# Patient Record
Sex: Female | Born: 1965 | Race: Black or African American | Hispanic: No | Smoking: Never smoker
Health system: Southern US, Community
[De-identification: ages and names within clinical notes are randomized; demographics above are authoritative.]

## PROBLEM LIST (undated history)

## (undated) DIAGNOSIS — K5790 Diverticulosis of intestine, part unspecified, without perforation or abscess without bleeding: Secondary | ICD-10-CM

## (undated) DIAGNOSIS — M1990 Unspecified osteoarthritis, unspecified site: Secondary | ICD-10-CM

## (undated) DIAGNOSIS — Z860100 Personal history of colon polyps, unspecified: Secondary | ICD-10-CM

## (undated) DIAGNOSIS — E669 Obesity, unspecified: Secondary | ICD-10-CM

## (undated) DIAGNOSIS — E7800 Pure hypercholesterolemia, unspecified: Secondary | ICD-10-CM

## (undated) DIAGNOSIS — M329 Systemic lupus erythematosus, unspecified: Secondary | ICD-10-CM

## (undated) DIAGNOSIS — H8109 Meniere's disease, unspecified ear: Secondary | ICD-10-CM

## (undated) DIAGNOSIS — I10 Essential (primary) hypertension: Secondary | ICD-10-CM

## (undated) HISTORY — DX: Diverticulosis of intestine, part unspecified, without perforation or abscess without bleeding: K57.90

## (undated) HISTORY — DX: Meniere's disease, unspecified ear: H81.09

## (undated) HISTORY — DX: Systemic lupus erythematosus, unspecified: M32.9

## (undated) HISTORY — PX: OTHER SURGICAL HISTORY: SHX169

## (undated) HISTORY — PX: COLONOSCOPY: SHX174

## (undated) HISTORY — DX: Obesity, unspecified: E66.9

## (undated) HISTORY — DX: Pure hypercholesterolemia, unspecified: E78.00

## (undated) HISTORY — DX: Unspecified osteoarthritis, unspecified site: M19.90

## (undated) HISTORY — DX: Personal history of colon polyps, unspecified: Z86.0100

---

## 1984-08-19 DIAGNOSIS — M329 Systemic lupus erythematosus, unspecified: Secondary | ICD-10-CM

## 1984-08-19 HISTORY — DX: Systemic lupus erythematosus, unspecified: M32.9

## 1998-08-19 DIAGNOSIS — I10 Essential (primary) hypertension: Secondary | ICD-10-CM

## 1998-08-19 HISTORY — DX: Essential (primary) hypertension: I10

## 2003-08-20 DIAGNOSIS — H8109 Meniere's disease, unspecified ear: Secondary | ICD-10-CM

## 2003-08-20 HISTORY — DX: Meniere's disease, unspecified ear: H81.09

## 2003-11-25 ENCOUNTER — Ambulatory Visit (HOSPITAL_COMMUNITY): Admission: RE | Admit: 2003-11-25 | Discharge: 2003-11-25 | Payer: Self-pay | Admitting: Obstetrics & Gynecology

## 2005-03-19 IMAGING — CT CT ABDOMEN W/O CM
1 series · 15 of 32 positions shown, 19 images · IV contrast (agent unspecified)
Comparison: none

CLINICAL DATA: Right renal colic.  Right lower abdominal and pelvic pain.  Question ureteral stone or renal obstruction.
TECHNIQUE: Spiral CT of the abdomen and pelvis was performed following the renal stone protocol.  No oral or intravenous contrast was administered.
 ABDOMEN CT WITHOUT CONTRAST:
 No intrarenal calculi are seen.  Both kidneys are normal in contour and there is no evidence of perinephric fluid or inflammatory change.  There is no evidence of hydronephrosis involving either kidney.  
 The visualized portions of the other abdominal parenchymal organs are unremarkable on this noncontrast study.  The unopacified bowel loops are unremarkable.  No mass or inflammatory process is identified.  
 IMPRESSION
 Unremarkable noncontrast abdominal CT.  No evidence of renal calculi or hydronephrosis.
 PELVIS CT WITHOUT CONTRAST:
 There is no evidence of ureteral calculi or dilatation.  No calculi are seen within the urinary bladder.  
 There is no evidence of pelvic masses. No inflammatory process or abnormal fluid collections are seen.  The unopacified bowel loops are unremarkable.
 Negative noncontrast pelvis CT.  No evidence of ureteral calculi or other acute process.

[Series 2: — · axial · 0.62mm/px · z∈[-317,+13]mm · 15 of 74 slices shown, 19 images]
[im 5/74  soft-tissue]
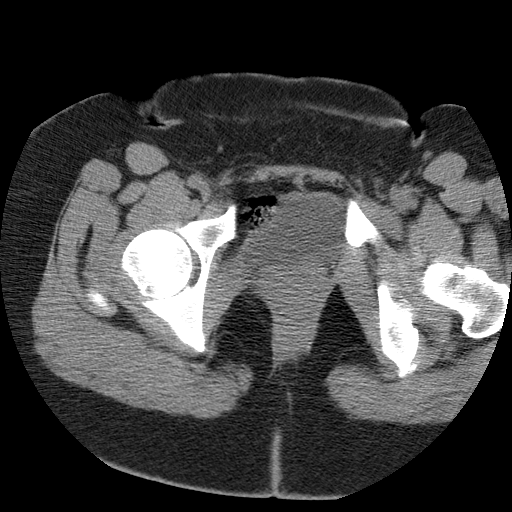
[im 5/74  bone]
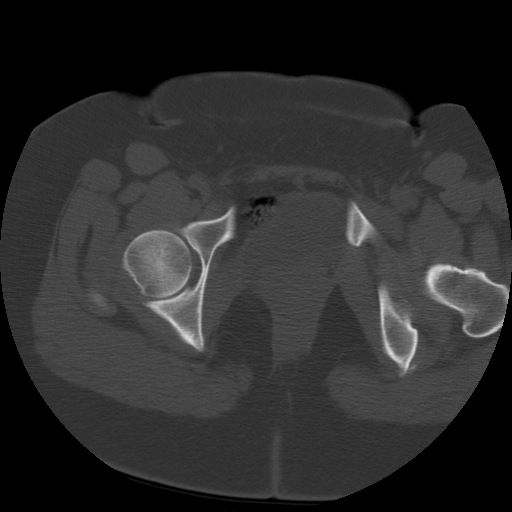
[im 10/74  soft-tissue]
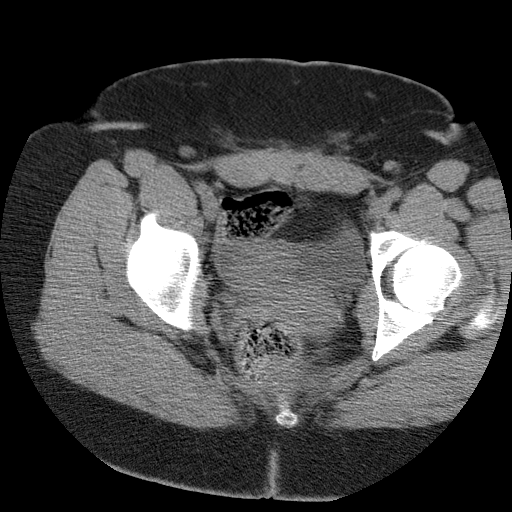
[im 15/74  soft-tissue]
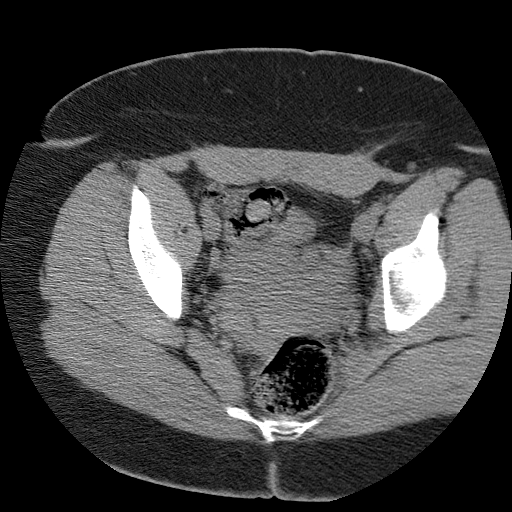
[im 22/74  soft-tissue]
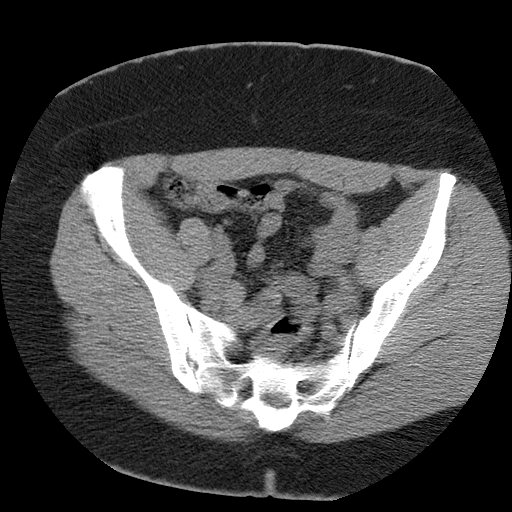
[im 26/74  soft-tissue]
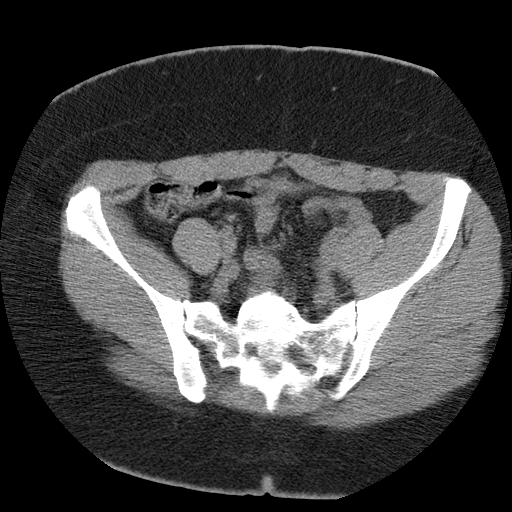
[im 31/74  soft-tissue]
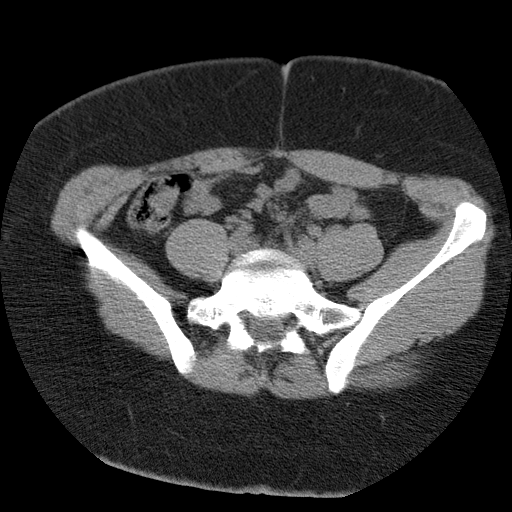
[im 38/74  soft-tissue]
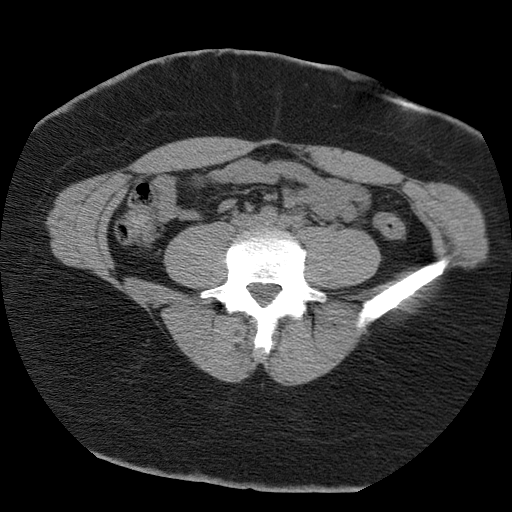
[im 43/74  soft-tissue]
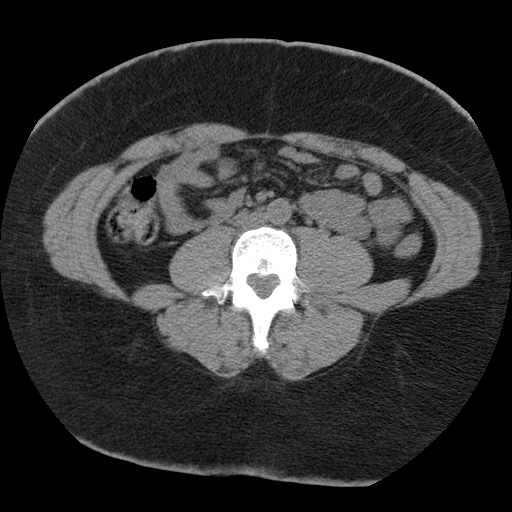
[im 48/74  soft-tissue]
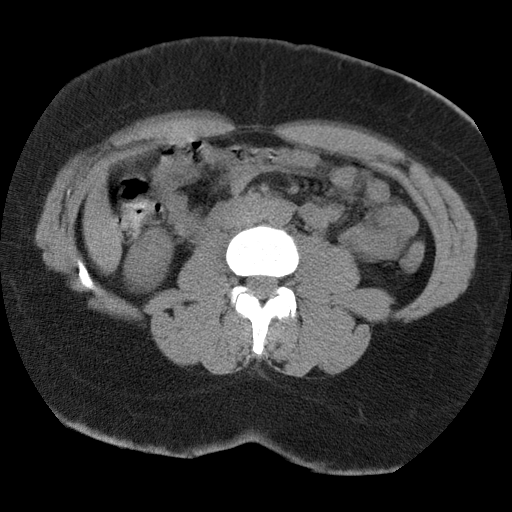
[im 48/74  bone]
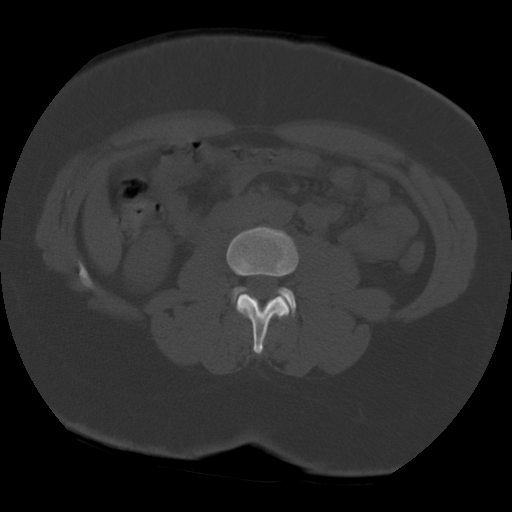
[im 52/74  soft-tissue]
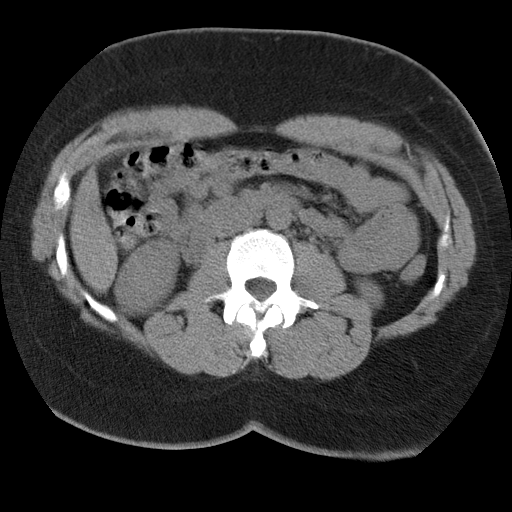
[im 59/74  soft-tissue]
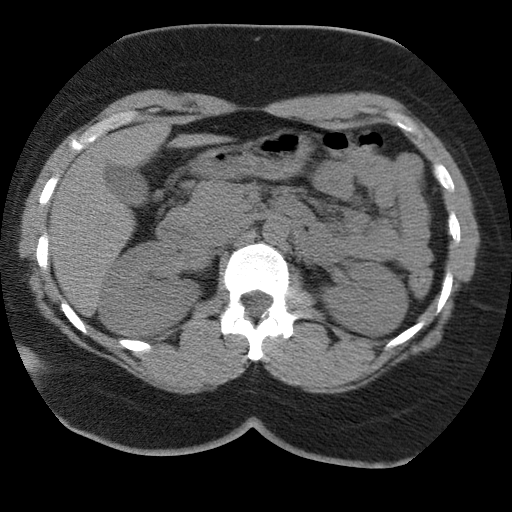
[im 64/74  soft-tissue]
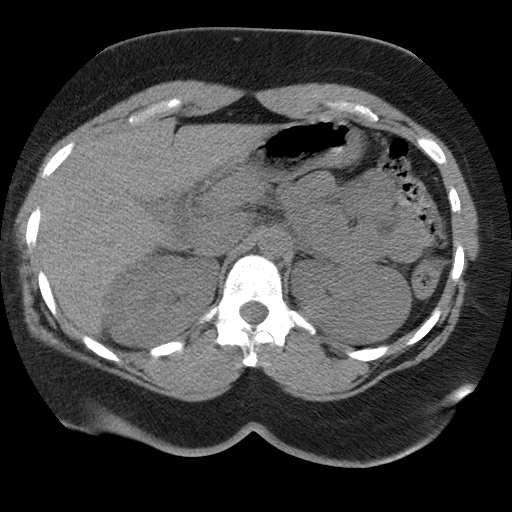
[im 64/74  lung]
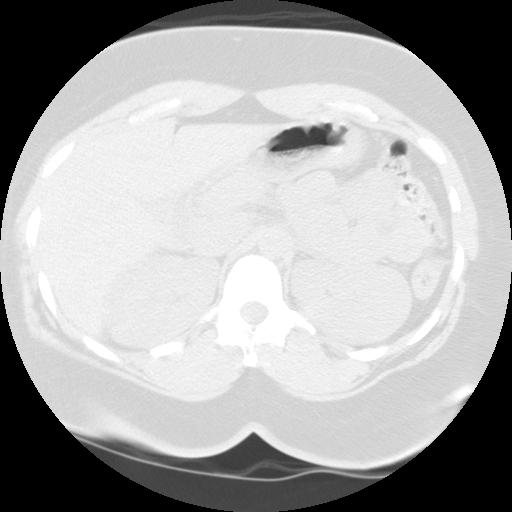
[im 66/74  lung]
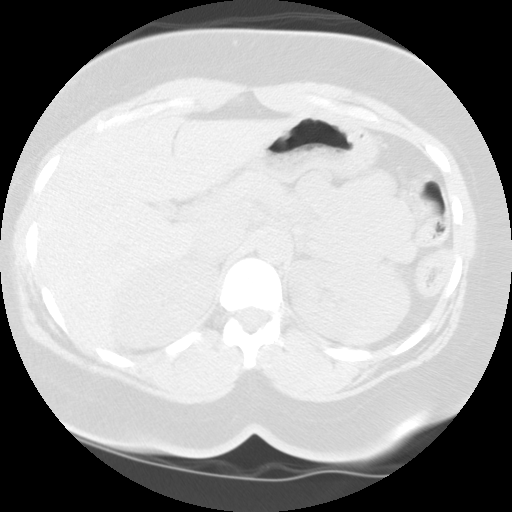
[im 69/74  soft-tissue]
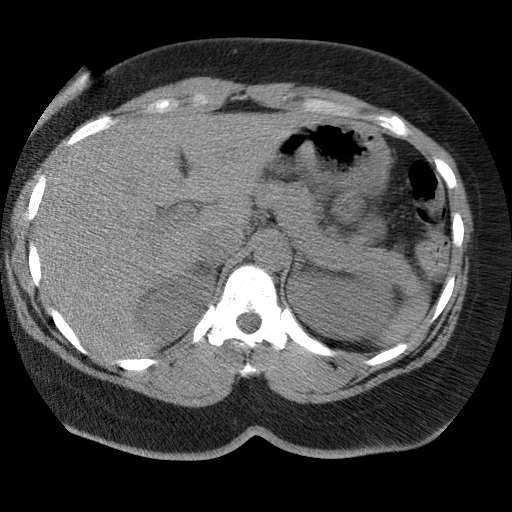
[im 69/74  lung]
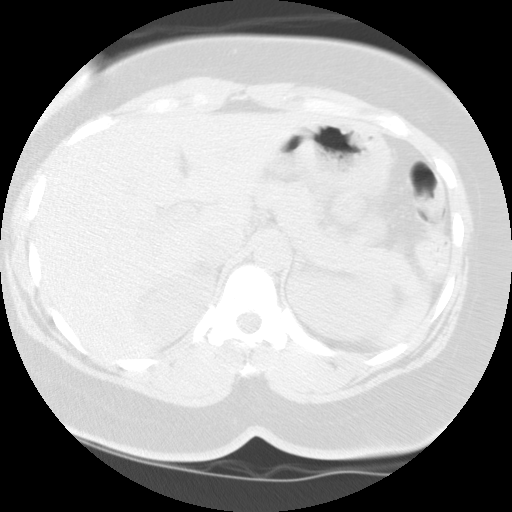
[im 71/74  lung]
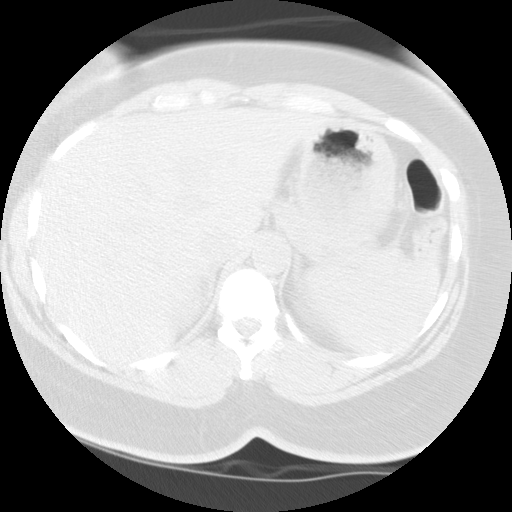

[15 of 32 positions shown; findings below may reference images not displayed]

## 2005-08-13 ENCOUNTER — Ambulatory Visit (HOSPITAL_COMMUNITY): Admission: RE | Admit: 2005-08-13 | Discharge: 2005-08-13 | Payer: Self-pay | Admitting: Obstetrics & Gynecology

## 2005-12-06 ENCOUNTER — Ambulatory Visit (HOSPITAL_COMMUNITY): Admission: RE | Admit: 2005-12-06 | Discharge: 2005-12-06 | Payer: Self-pay | Admitting: Obstetrics & Gynecology

## 2005-12-06 ENCOUNTER — Encounter (INDEPENDENT_AMBULATORY_CARE_PROVIDER_SITE_OTHER): Payer: Self-pay | Admitting: *Deleted

## 2006-02-05 ENCOUNTER — Ambulatory Visit (HOSPITAL_COMMUNITY): Admission: RE | Admit: 2006-02-05 | Discharge: 2006-02-05 | Payer: Self-pay | Admitting: Obstetrics & Gynecology

## 2007-05-26 ENCOUNTER — Ambulatory Visit (HOSPITAL_COMMUNITY): Admission: RE | Admit: 2007-05-26 | Discharge: 2007-05-26 | Payer: Self-pay | Admitting: Obstetrics & Gynecology

## 2007-08-20 HISTORY — PX: BREAST REDUCTION SURGERY: SHX8

## 2008-05-03 ENCOUNTER — Ambulatory Visit (HOSPITAL_BASED_OUTPATIENT_CLINIC_OR_DEPARTMENT_OTHER): Admission: RE | Admit: 2008-05-03 | Discharge: 2008-05-03 | Payer: Self-pay | Admitting: Plastic Surgery

## 2008-05-03 ENCOUNTER — Encounter (INDEPENDENT_AMBULATORY_CARE_PROVIDER_SITE_OTHER): Payer: Self-pay | Admitting: Plastic Surgery

## 2008-08-19 HISTORY — PX: BREAST BIOPSY: SHX20

## 2009-08-19 HISTORY — PX: ABDOMINAL HYSTERECTOMY: SHX81

## 2010-06-01 ENCOUNTER — Ambulatory Visit (HOSPITAL_COMMUNITY): Admission: RE | Admit: 2010-06-01 | Discharge: 2010-06-01 | Payer: Self-pay | Admitting: Obstetrics & Gynecology

## 2010-06-18 ENCOUNTER — Other Ambulatory Visit: Payer: Self-pay | Admitting: Anesthesiology

## 2010-06-18 ENCOUNTER — Other Ambulatory Visit: Payer: Self-pay | Admitting: Obstetrics & Gynecology

## 2010-06-22 ENCOUNTER — Encounter: Payer: Self-pay | Admitting: Obstetrics & Gynecology

## 2010-06-22 ENCOUNTER — Inpatient Hospital Stay (HOSPITAL_COMMUNITY): Admission: RE | Admit: 2010-06-22 | Discharge: 2010-06-25 | Payer: Self-pay | Admitting: Obstetrics & Gynecology

## 2010-09-08 ENCOUNTER — Encounter: Payer: Self-pay | Admitting: Obstetrics & Gynecology

## 2010-10-02 ENCOUNTER — Other Ambulatory Visit: Payer: Self-pay | Admitting: Internal Medicine

## 2010-10-02 ENCOUNTER — Ambulatory Visit
Admission: RE | Admit: 2010-10-02 | Discharge: 2010-10-02 | Disposition: A | Discharge status: Home / Self Care | Payer: BC Managed Care – PPO | Source: Ambulatory Visit | Attending: Internal Medicine | Admitting: Internal Medicine

## 2010-10-02 DIAGNOSIS — R05 Cough: Secondary | ICD-10-CM

## 2010-10-30 LAB — CBC
Platelets: 209 10*3/uL (ref 150–400)
RDW: 14.9 % (ref 11.5–15.5)
WBC: 13.5 10*3/uL — ABNORMAL HIGH (ref 4.0–10.5)

## 2010-10-30 LAB — COMPREHENSIVE METABOLIC PANEL
ALT: 99 U/L — ABNORMAL HIGH (ref 0–35)
Alkaline Phosphatase: 44 U/L (ref 39–117)
BUN: 6 mg/dL (ref 6–23)
CO2: 26 mEq/L (ref 19–32)
GFR calc non Af Amer: >60 mL/min (ref >60)
Glucose, Bld: 103 mg/dL — ABNORMAL HIGH (ref 70–99)
Potassium: 3.7 mEq/L (ref 3.5–5.1)
Sodium: 131 mEq/L — ABNORMAL LOW (ref 135–145)

## 2010-10-31 LAB — BASIC METABOLIC PANEL
BUN: 7 mg/dL (ref 6–23)
GFR calc Af Amer: >60 mL/min (ref >60)
GFR calc non Af Amer: >60 mL/min (ref >60)
Potassium: 3.4 mEq/L — ABNORMAL LOW (ref 3.5–5.1)
Sodium: 134 mEq/L — ABNORMAL LOW (ref 135–145)

## 2010-10-31 LAB — SURGICAL PCR SCREEN
MRSA, PCR: NEGATIVE
Staphylococcus aureus: NEGATIVE

## 2010-10-31 LAB — CBC
MCV: 87.6 fL (ref 78.0–100.0)
Platelets: 225 10*3/uL (ref 150–400)
RDW: 14.9 % (ref 11.5–15.5)
WBC: 6.7 10*3/uL (ref 4.0–10.5)

## 2011-01-01 NOTE — Op Note (Signed)
NAME:  Dawn Lara, Dawn Lara              ACCOUNT NO.:  [id number]   MEDICAL RECORD NO.:  [id number]          PATIENT TYPE:  AMB   LOCATION:  DSC                          FACILITY:  [facility]   PHYSICIAN:  Brantley Persons, M.D.DATE OF BIRTH:  [birth year]   DATE OF PROCEDURE:  DATE OF DISCHARGE:                               OPERATIVE REPORT   ADDENDUM   The patient was recovering without complications.  Both the patient and  her husband were given proper postoperative wound care instructions  including care of the JP drains.  Then she was discharged home in the  care of her husband in stable condition.  Followup appointment will be  Friday in the office.           ______________________________  Brantley Persons, M.D.     MC/MEDQ  D:  05/03/2008  T:  05/04/2008  Job:  [id number]

## 2011-01-01 NOTE — Op Note (Signed)
NAME:  Dawn Lara, Dawn Lara              ACCOUNT NO.:  [id number]   MEDICAL RECORD NO.:  [id number]          PATIENT TYPE:  AMB   LOCATION:  DSC                          FACILITY:  [facility]   PHYSICIAN:  Brantley Persons, M.D.DATE OF BIRTH:  1966-02-25   DATE OF PROCEDURE:  05/03/2008  DATE OF DISCHARGE:                               OPERATIVE REPORT   PREOPERATIVE DIAGNOSIS:  Bilateral macromastia.   POSTOPERATIVE DIAGNOSIS:  Bilateral macromastia.   PROCEDURE:  Bilateral reduction mammoplasties.   ATTENDING SURGEON:  Brantley Persons, MD   ANESTHESIA:  General.   ANESTHESIOLOGIST:  Zenon Mayo, MD   COMPLICATIONS:  None.   ESTIMATED BLOOD LOSS:  200 mL.   FLUID REPLACEMENT:  3000 mL of crystalloid.   URINE OUTPUT:  100 mL.   INDICATIONS FOR PROCEDURE:  The patient is a 45 year old African  American female who has bilateral macromastia that is clinically  symptomatic.  She presents to undergo bilateral reduction mammoplasties.   PROCEDURE:  The patient was marked in the preop holding area in the  pattern of Wise for the future bilateral reduction mammoplasties.  She  was then taken back to the OR, placed in the table in supine position.  After adequate general anesthesia was obtained, the patient's chest was  prepped with Techni-Care and draped in sterile fashion.  The base of the  breast had been injected with 1% lidocaine with epinephrine.  After  adequate hemostasis and anesthesia had taken effect, the procedure was  begun.   Both of the breast reduction were performed in the following similar  manner.  The nipple-areolar complexes were marked with a 45-mm nipple  marker.  This was then incised, and the skin was de-epithelialized  around the nipple-areolar complex down to the inframammary crease in the  inferior pedicle pattern.  Next, the medial, superior, and lateral skin  flaps were elevated down to the chest wall.  The excess fat and  glandular tissue were  removed from the inferior pedicle.  The nipple-  areolar complex was examined and found to be pink and viable.  The wound  was irrigated with saline irrigation.  Meticulous hemostasis was  obtained with the Bovie electrocautery.  Inferior pedicle was  centralized using 3-0 Prolene suture.  A #10 JP flat fully fluted drain  was placed into the wound.  The skin flaps were brought together at the  inverted T junction with a 2-0 Prolene suture.  The incisions were  stapled for temporary closure.  The breasts were compared and found to  have good shape and symmetry.  The incisions were then closed from the  medial aspect of the JP drain to the medial aspect of the [facility] incision  using a few 3-0 Monocryl sutures were loosely right together at the  dermal layer and then both the dermal and cuticular layer were closed in  a single layer using a Quill 2-0 PDO barbed suture.  Lateral to the JP  drain, the incision was closed using 3-0 Monocryl suture in the dermal  layer followed by 3-0 Monocryl running intracuticular stitch on the  skin.  The breasts were compared and found to have good shape and symmetry.  The patient was placed in the upright position.  The future location of  the nipple-areolar complexes was marked on both breast mounds using the  45-mm nipple marker.  She was then placed back into recumbent position.   Both of the nipple-areolar complexes were then brought out onto the  breast mounds in the following similar manner.  The skin was incised as  marked and removed full thickness of the skin and subcutaneous tissues.  The nipple-areolar complexes were examined, found to be pink and viable,  then brought out through this aperture and sewn in place using 4-0  Monocryl in the dermal layer followed by 5-0 Monocryl running  intracuticular stitch on the skin.  The dermal layer of the vertical  limb of the Wise pattern had been sewn together using 3-0 Monocryl  suture and then this 5-0  Monocryl suture from the nipple-areolar complex  was then brought down to close the cuticular layer of the vertical limb  in continuity.  The JP drain was sewn in place using 3-0 nylon suture.  The incisions were dressed with Benzoin and Steri-Strips and the nipples  additionally with bacitracin ointment and Adaptic.  A 4 x 4's were  placed over the incisions and ABD pads in the axillary areas.  The  patient was placed into a light postoperative support bra.  There were  no complications.  The patient tolerated the procedure well.  The final  needle and sponge counts were report to be correct at the end of the  case.  The patient was then awakened from general anesthesia and taken  to recovery room in stable condition.  She was recovered without  complications.  The patient wanted to be discharged home rather than  stay overnight.  This was appropriate.  Both the patient and her husband  were given proper postoperative wound care instructions including care  of the JP drains.  Followup appointments is planned for Friday in the  office.           ______________________________  Brantley Persons, M.D.     MC/MEDQ  D:  05/03/2008  T:  05/04/2008  Job:  [id number]

## 2011-01-04 NOTE — Op Note (Signed)
Dawn Lara, Dawn Lara              ACCOUNT NO.:  [id number]   MEDICAL RECORD NO.:  [id number]          PATIENT TYPE:  AMB   LOCATION:  SDC                           FACILITY:  [facility]   PHYSICIAN:  Roseanna Rainbow, M.D.DATE OF BIRTH:  Feb 17, 1966   DATE OF PROCEDURE:  12/06/2005  DATE OF DISCHARGE:                                 OPERATIVE REPORT   PREOPERATIVE DIAGNOSIS:  Rule out endometrial polyp.   POSTOPERATIVE DIAGNOSIS:  Rule out endometrial polyp.   PROCEDURE:  Dilatation and curettage, hysteroscopy.   SURGEON:  Roseanna Rainbow, M.D.   ANESTHESIA:  General endotracheal anesthesia.   ESTIMATED BLOOD LOSS:  Minimal.   COMPLICATIONS:  None.   IV FLUIDS:  Per anesthesiology.   URINE OUTPUT:  300 mL clear urine at the beginning of the procedure.   PROCEDURE:  The patient was taken to the operating room with an IV running.  She was placed in the dorsal lithotomy position and prepped and draped in  the usual sterile fashion. A weighted speculum and Sims retractor were  placed into the vagina. The anterior lip of the cervix was grasped with a  single-tooth tenaculum. The cervix was then dilated with [facility] dilators. Two  attempts were made to introduce the operative scope, however, there was  difficulty maneuvering of the operative scope past the internal os. At this  point, a diagnostic hysteroscopy was performed. The endometrium appeared  somewhat lush and polypoid, however, there was discrete broad based polyp  arising from the anterior wall near the fundus. The tubal ostia were not was  well visualized. The hysteroscope was then removed. A sharp curettage was  performed and the polypoid tissue described above was noted in the  curettings. Please note the uterus had sounded to 8 cm. The single-tooth  tenaculum was then removed from the anterior lip of the cervix. Silver  nitrate was applied to the tenaculum site. Adequate hemostasis was noted. At  the close of the  procedure, the instrument and pack counts were said to be  correct x2. The patient was taken to the PACU awake and in stable condition.   PATHOLOGY:  Endometrial curettings.     Roseanna Rainbow, M.D.  Electronically Signed    LAJ/MEDQ  D:  12/06/2005  T:  12/07/2005  Job:  [id number]

## 2011-05-20 LAB — POCT HEMOGLOBIN-HEMACUE: Hemoglobin: 13.1

## 2011-05-22 LAB — BASIC METABOLIC PANEL
BUN: 7
GFR calc non Af Amer: >60
Potassium: 3.8

## 2012-01-25 IMAGING — CR DG CHEST 2V
2 series · 2 of 2 positions shown · non-contrast
Comparison: None.

CLINICAL DATA: Cough, chest tightness and wheezing

CHEST - 2 VIEW

[w chest pa]
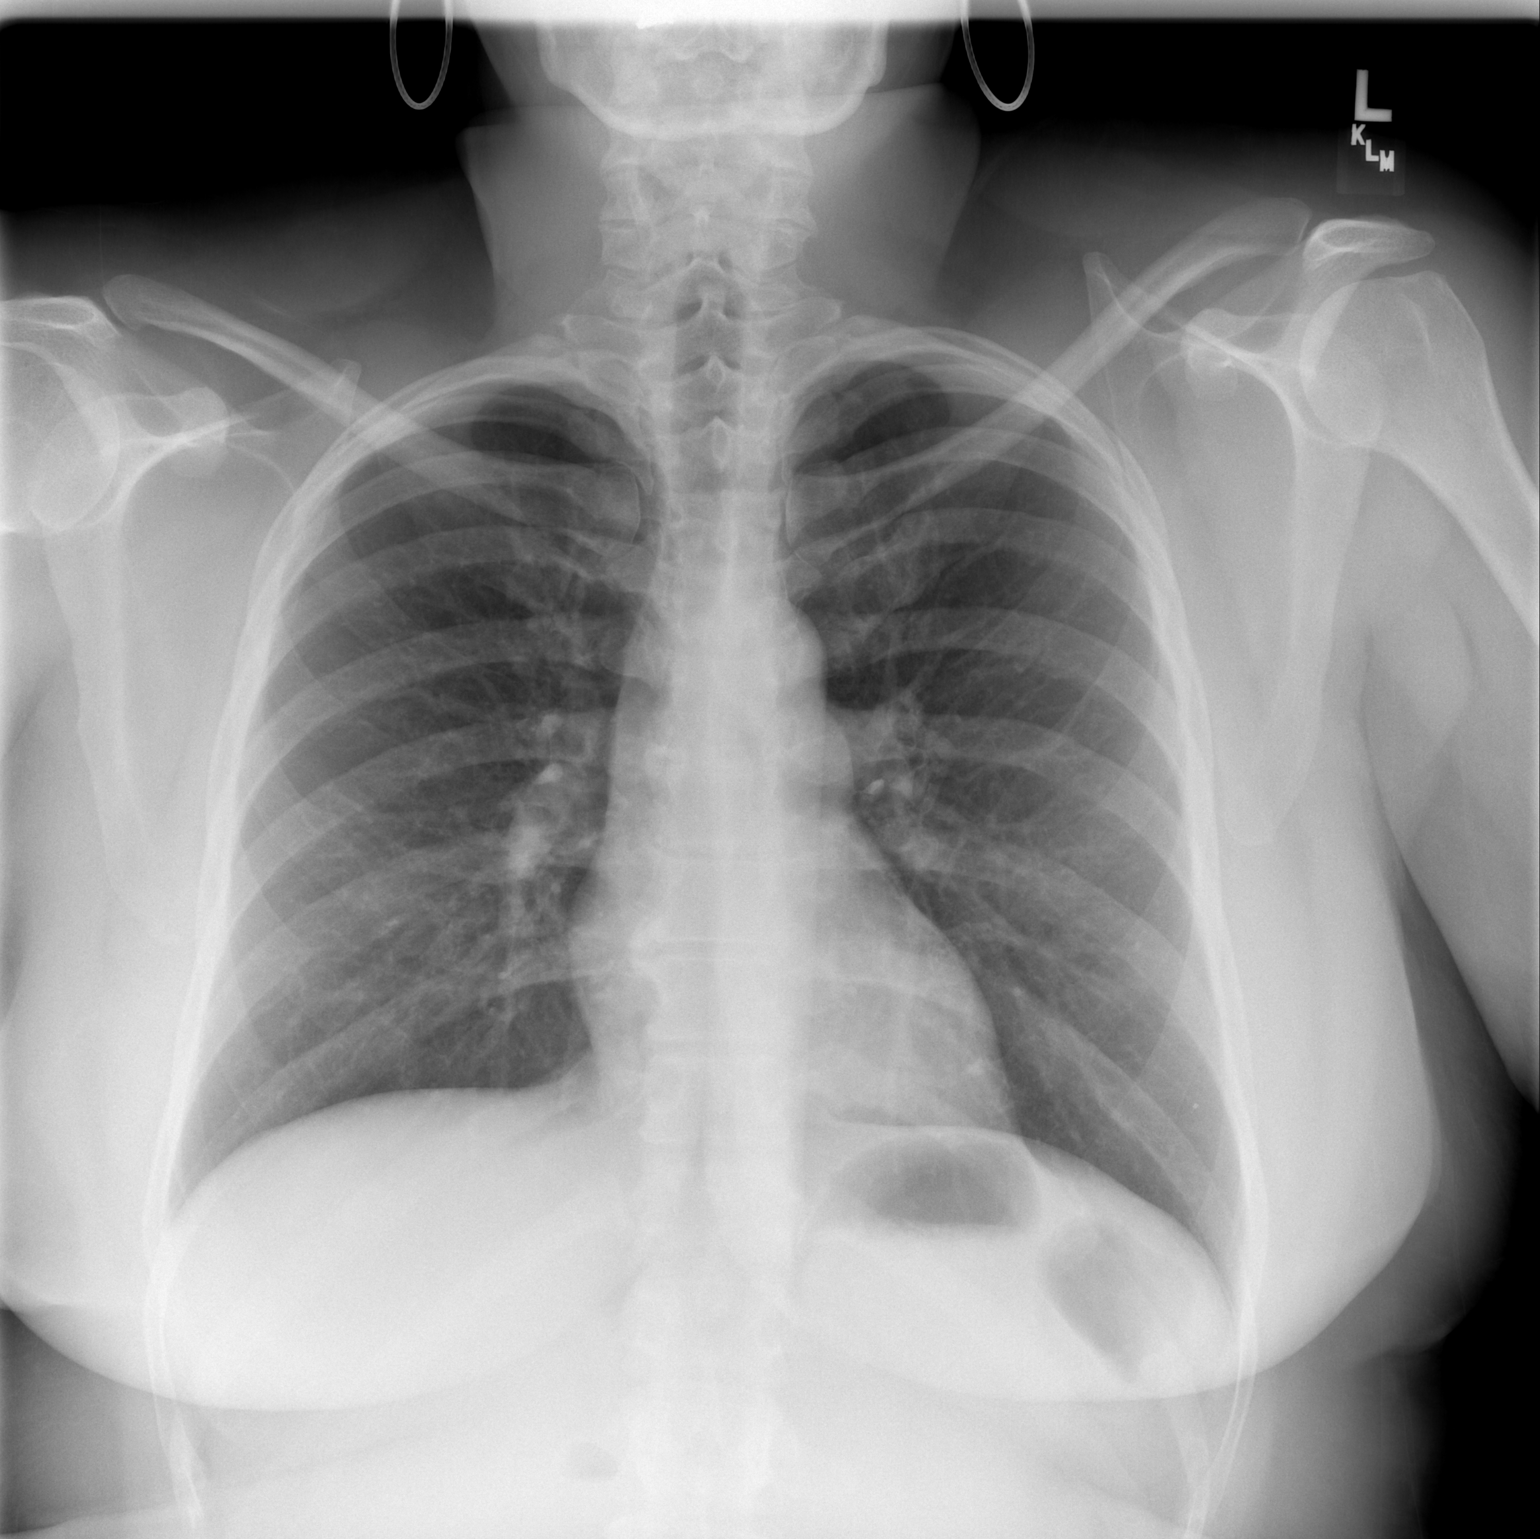

[w chest lat]
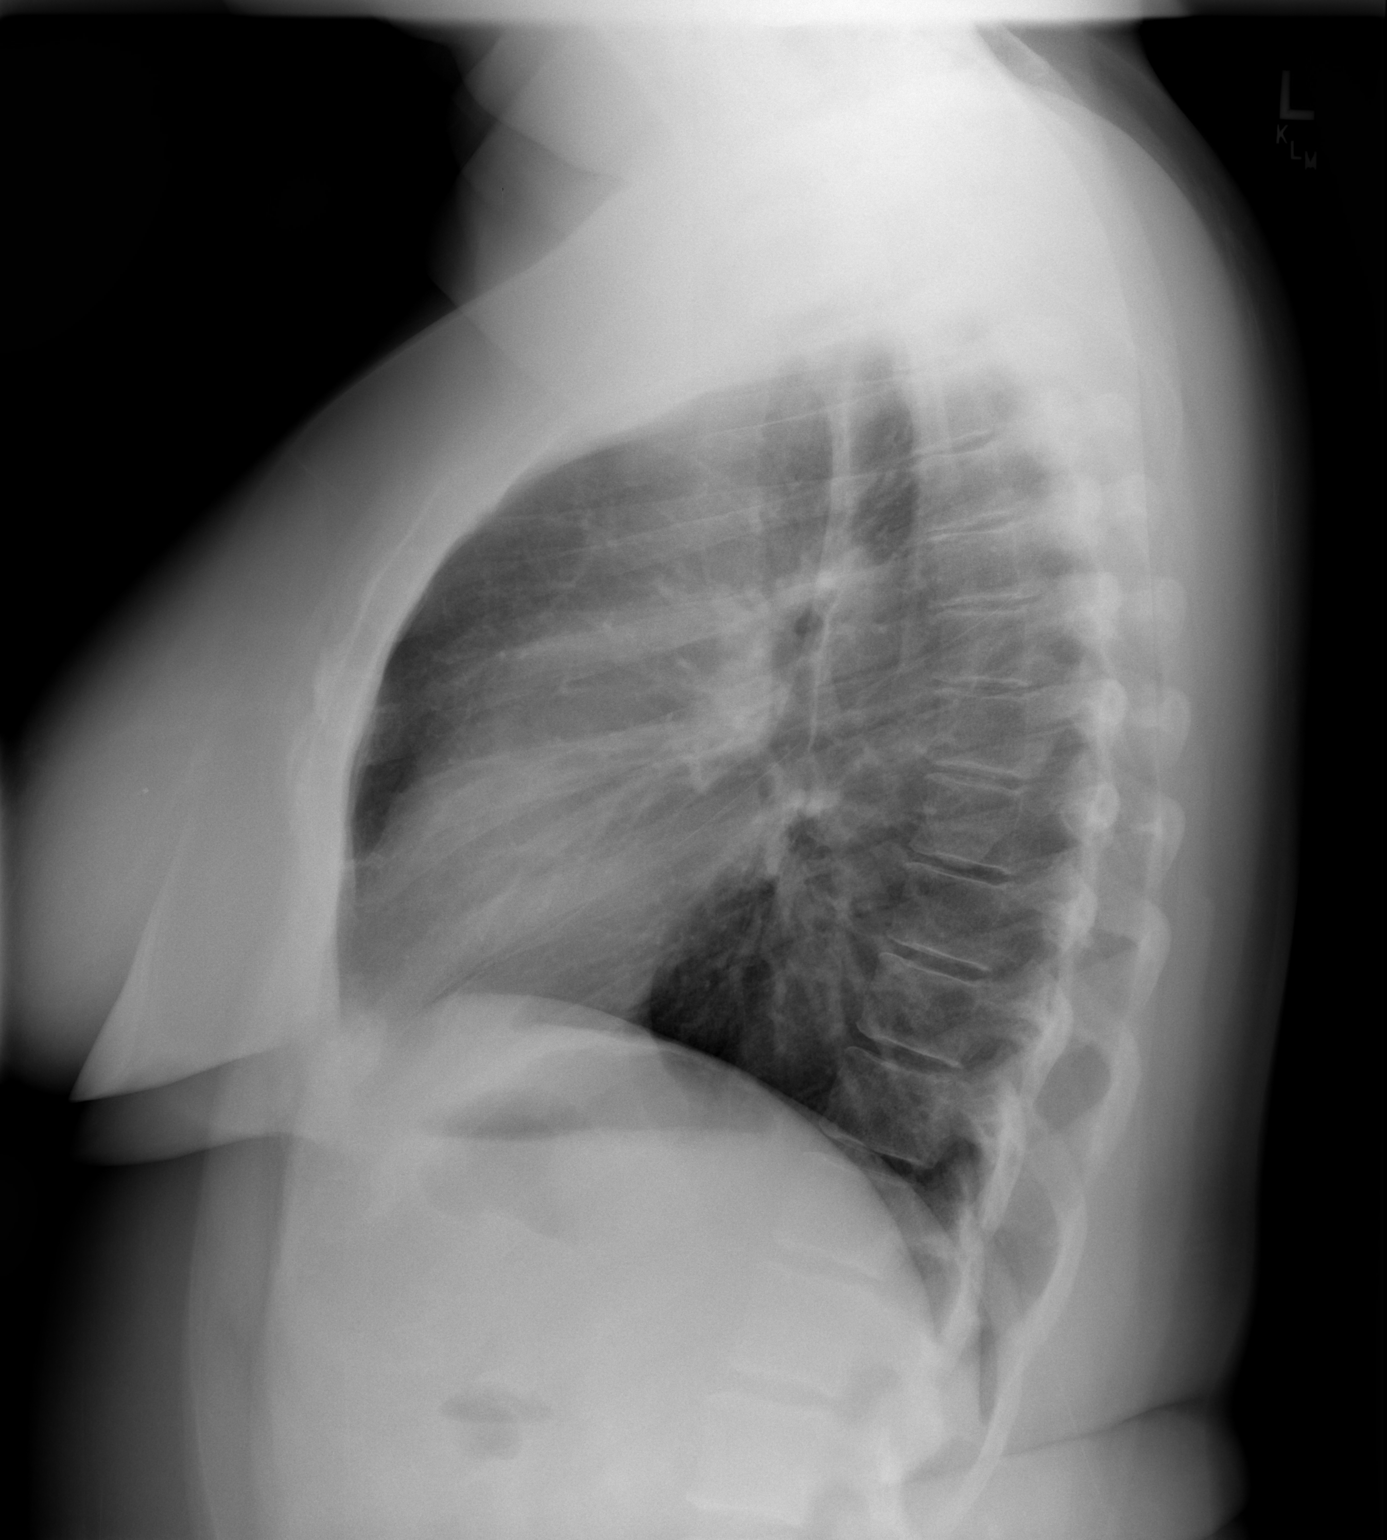

[2 of 2 positions shown; findings below may reference images not displayed]

FINDINGS: No active infiltrate or effusion is seen.  There may be
mild peribronchial thickening present.  Mediastinal contours are
normal.  The heart is within normal limits in size.  No bony
abnormality is seen.
IMPRESSION: No active lung disease.  Question mild peribronchial thickening.

## 2012-02-06 ENCOUNTER — Emergency Department (HOSPITAL_COMMUNITY)
Admission: EM | Admit: 2012-02-06 | Discharge: 2012-02-06 | Disposition: A | Discharge status: Home / Self Care | Payer: 59 | Source: Home / Self Care | Attending: Emergency Medicine | Admitting: Emergency Medicine

## 2012-02-06 ENCOUNTER — Encounter (HOSPITAL_COMMUNITY): Payer: Self-pay | Admitting: *Deleted

## 2012-02-06 DIAGNOSIS — S46811A Strain of other muscles, fascia and tendons at shoulder and upper arm level, right arm, initial encounter: Secondary | ICD-10-CM

## 2012-02-06 DIAGNOSIS — S46819A Strain of other muscles, fascia and tendons at shoulder and upper arm level, unspecified arm, initial encounter: Secondary | ICD-10-CM

## 2012-02-06 DIAGNOSIS — S43499A Other sprain of unspecified shoulder joint, initial encounter: Secondary | ICD-10-CM

## 2012-02-06 HISTORY — DX: Essential (primary) hypertension: I10

## 2012-02-06 MED ORDER — METAXALONE 800 MG PO TABS: 800.0000 mg | ORAL_TABLET | Freq: Three times a day (TID) | ORAL | Status: AC

## 2012-02-06 MED ORDER — HYDROCODONE-ACETAMINOPHEN 5-325 MG PO TABS: 2.0000 | ORAL_TABLET | ORAL | Status: AC | PRN

## 2012-02-06 NOTE — ED Notes (Signed)
Pt is here with complaints of right sided neck and shoulder pain with onset yesterday.  No known injury.  No additional symptoms.

## 2012-02-06 NOTE — Discharge Instructions (Signed)
Take the medication as written. Take 1 gram of tylenol with the motrin up to 4 times a day as needed for pain and fever. This with the meloxicam is an effective combination for pain. Take the hydrocodone/norco only for severe pain. Do not take the tylenol and hydrocodone/norco as they both have tylenol in them and too much can hurt your liver. Do not exceed 4 grams of tylenol a day from all sources. Return if you get worse, have a  fever >100.4, or for any concerns.   Go to www.goodrx.com to look up your medications. This will give you a list of where you can find your prescriptions at the most affordable prices.

## 2012-02-07 NOTE — ED Provider Notes (Signed)
History     CSN: [id number]  Arrival date & time 02/06/12  1753   First MD Initiated Contact with Patient 02/06/12 1800      Chief Complaint  Patient presents with  . Neck Pain    (Consider location/radiation/quality/duration/timing/severity/associated sxs/prior treatment) HPI Comments: Patient states that she was working, with her head bent looking down at her computer, and had a sudden onset crampy, achy right-sided posterior neck pain that goes to her shoulder. No recent or remote injury to her neck or shoulder. No headaches, nausea, vomiting.  ROS as noted in HPI. All other ROS negative.   Patient is a 46 y.o. female presenting with neck pain. The history is provided by the patient. No language interpreter was used.  Neck Pain  This is a new problem. The current episode started yesterday. The problem occurs constantly. The pain is associated with an unknown factor. There has been no fever. The quality of the pain is described as aching and cramping. The pain radiates to the right shoulder. Exacerbated by: Head rotation, lateral bending of neck. The pain is worse during the day. Pertinent negatives include no numbness and no tingling. She has tried nothing for the symptoms. The treatment provided no relief.    Past Medical History  Diagnosis Date  . Hypertension     Past Surgical History  Procedure Date  . Abdominal hysterectomy   . Myomecotomy     History reviewed. No pertinent family history.  History  Substance Use Topics  . Smoking status: Never Smoker   . Smokeless tobacco: Not on file  . Alcohol Use: Yes    OB History    Grav Para Term Preterm Abortions TAB SAB Ect Mult Living                  Review of Systems  HENT: Positive for neck pain.   Neurological: Negative for tingling and numbness.    Allergies  Sulfa antibiotics  Home Medications   Current Outpatient Rx  Name Route Sig Dispense Refill  . IBUPROFEN 800 MG PO TABS Oral Take 800 mg by  mouth every 8 (eight) hours as needed.    Marland Kitchen LISINOPRIL-HYDROCHLOROTHIAZIDE 20-12.5 MG PO TABS Oral Take 1 tablet by mouth daily.    Marland Kitchen HYDROCODONE-ACETAMINOPHEN 5-325 MG PO TABS Oral Take 2 tablets by mouth every 4 (four) hours as needed for pain. 20 tablet 0  . METAXALONE 800 MG PO TABS Oral Take 1 tablet (800 mg total) by mouth 3 (three) times daily. 21 tablet 0    BP 141/71  Pulse 64  Temp 98.9 F (37.2 C) (Oral)  Resp 16  SpO2 100%  Physical Exam  Nursing note and vitals reviewed. Constitutional: She is oriented to person, place, and time. She appears well-developed and well-nourished. No distress.  HENT:  Head: Normocephalic and atraumatic.  Eyes: Conjunctivae and EOM are normal.  Neck: Muscular tenderness present. No spinous process tenderness present. Decreased range of motion present.         Muscle spasm, see drawing. Patient able to move neck through full range of motion. Pain aggravated with rotating head to the right, and lateral bending  Cardiovascular: Normal rate.   Pulmonary/Chest: Effort normal.  Abdominal: She exhibits no distension.  Neurological: She is alert and oriented to person, place, and time.  Skin: Skin is warm and dry.  Psychiatric: She has a normal mood and affect. Her behavior is normal. Judgment and thought content normal.    ED Course  Procedures (including critical care time)  Labs Reviewed - No data to display No results found.   1. Strain of right trapezius muscle     MDM  H&P most consistent with muscle strain/spasm. Will send home with NSAIDs, muscle relaxants. Advised her to frequently change her neck position when working. Discussed symptoms that should prompt return to the department. Patient agrees with plan. She'll followup with her primary care physician as needed.  Luiz Blare, MD 02/07/12 2047

## 2012-02-07 NOTE — ED Notes (Signed)
Call from [place], [place]; Skelaxin on back order, requesting an alternate medication . Spoke directly w Dr Chaney Malling , who authorized robaxin, 500 mg, PO QID, PRN muscle spasm, quant 40, NR. Call back to [facility], spoke directly w pharmacist

## 2013-08-02 ENCOUNTER — Ambulatory Visit: Payer: Self-pay | Admitting: Obstetrics & Gynecology

## 2013-10-05 ENCOUNTER — Encounter (HOSPITAL_COMMUNITY): Payer: Self-pay | Admitting: Emergency Medicine

## 2013-10-05 ENCOUNTER — Emergency Department (HOSPITAL_COMMUNITY)
Admission: EM | Admit: 2013-10-05 | Discharge: 2013-10-05 | Disposition: A | Discharge status: Home / Self Care | Payer: PRIVATE HEALTH INSURANCE | Source: Home / Self Care | Attending: Family Medicine | Admitting: Family Medicine

## 2013-10-05 DIAGNOSIS — K5289 Other specified noninfective gastroenteritis and colitis: Secondary | ICD-10-CM

## 2013-10-05 DIAGNOSIS — K529 Noninfective gastroenteritis and colitis, unspecified: Secondary | ICD-10-CM

## 2013-10-05 MED ORDER — LOPERAMIDE HCL 2 MG PO CAPS: 4.0000 mg | ORAL_CAPSULE | Freq: Four times a day (QID) | ORAL | Status: DC | PRN

## 2013-10-05 NOTE — Discharge Instructions (Signed)
Clear liquid , bland diet tonight as tolerated, advance on wed as improved, use medicine as needed, return or see your doctor if any problems. °

## 2013-10-05 NOTE — ED Notes (Signed)
Pt c/o abd pain onset Sunday Sxs include f/v/d, decreased appetite Denies cold sxs, urinary sxs, gyn sxs Taking pepto bismol and advil w/no relief She is alert w/no signs of acute distress.

## 2013-10-05 NOTE — ED Provider Notes (Signed)
CSN: [id number]     Arrival date & time 10/05/13  1132 History   First MD Initiated Contact with Patient 10/05/13 1140     Chief Complaint  Patient presents with  . Abdominal Pain     (Consider location/radiation/quality/duration/timing/severity/associated sxs/prior Treatment) Patient is a 48 y.o. female presenting with abdominal pain. The history is provided by the patient.  Abdominal Pain Pain location:  Generalized Pain radiates to:  Does not radiate Pain severity:  Mild Onset quality:  Gradual Duration:  2 days Progression:  Improving Chronicity:  New Associated symptoms: chills, diarrhea, nausea and vomiting   Associated symptoms: no dysuria, no fever, no hematemesis, no hematochezia, no hematuria and no melena     Past Medical History  Diagnosis Date  . Hypertension    Past Surgical History  Procedure Laterality Date  . Abdominal hysterectomy    . Myomecotomy     No family history on file. History  Substance Use Topics  . Smoking status: Never Smoker   . Smokeless tobacco: Not on file  . Alcohol Use: Yes   OB History   Grav Para Term Preterm Abortions TAB SAB Ect Mult Living                 Review of Systems  Constitutional: Positive for chills. Negative for fever.  HENT: Negative.   Cardiovascular: Negative.   Gastrointestinal: Positive for nausea, vomiting, abdominal pain and diarrhea. Negative for melena, hematochezia and hematemesis.  Genitourinary: Negative.  Negative for dysuria and hematuria.  Musculoskeletal: Negative.   Skin: Negative.       Allergies  Sulfa antibiotics  Home Medications   Current Outpatient Rx  Name  Route  Sig  Dispense  Refill  . ibuprofen (ADVIL,MOTRIN) 800 MG tablet   Oral   Take 800 mg by mouth every 8 (eight) hours as needed.         Marland Kitchen. lisinopril-hydrochlorothiazide (PRINZIDE,ZESTORETIC) 20-12.5 MG per tablet   Oral   Take 1 tablet by mouth daily.         Marland Kitchen. loperamide (IMODIUM) 2 MG capsule   Oral  Take 2 capsules (4 mg total) by mouth 4 (four) times daily as needed for diarrhea or loose stools.   20 capsule   0    BP 130/86  Pulse 90  Temp(Src) 98.1 F (36.7 C) (Oral)  Resp 20  SpO2 96% Physical Exam  Nursing note and vitals reviewed. Constitutional: She is oriented to person, place, and time. She appears well-developed and well-nourished. No distress.  Neck: Normal range of motion. Neck supple.  Cardiovascular: Regular rhythm.   Pulmonary/Chest: Breath sounds normal.  Abdominal: Soft. Bowel sounds are normal. She exhibits no distension and no mass. There is no tenderness. There is no rebound and no guarding.  Lymphadenopathy:    She has no cervical adenopathy.  Neurological: She is alert and oriented to person, place, and time.  Skin: Skin is warm and dry.    ED Course  Procedures (including critical care time) Labs Review Labs Reviewed - No data to display Imaging Review No results found.    MDM   Final diagnoses:  Gastroenteritis, acute        Linna HoffJames D Kindl, MD 10/05/13 [phone]

## 2013-10-06 ENCOUNTER — Encounter: Payer: Self-pay | Admitting: Obstetrics & Gynecology

## 2013-10-06 ENCOUNTER — Ambulatory Visit (INDEPENDENT_AMBULATORY_CARE_PROVIDER_SITE_OTHER): Payer: 59 | Admitting: Obstetrics & Gynecology

## 2013-10-06 VITALS — BP 126/81 | HR 90 | Temp 98.1°F | Ht 68.0 in | Wt 247.0 lb

## 2013-10-06 DIAGNOSIS — Z0000 Encounter for general adult medical examination without abnormal findings: Secondary | ICD-10-CM

## 2013-10-06 DIAGNOSIS — Z01419 Encounter for gynecological examination (general) (routine) without abnormal findings: Secondary | ICD-10-CM

## 2013-10-06 LAB — POCT URINALYSIS DIPSTICK
Bilirubin, UA: NEGATIVE
GLUCOSE UA: NEGATIVE
KETONES UA: NEGATIVE
Leukocytes, UA: NEGATIVE
Nitrite, UA: NEGATIVE
Protein, UA: NEGATIVE
RBC UA: NEGATIVE
SPEC GRAV UA: 1.02
UROBILINOGEN UA: NEGATIVE
pH, UA: 5

## 2013-10-06 NOTE — Progress Notes (Signed)
Subjective:     Dawn Lara is a 48 y.o. female here for a routine exam.  Current complaints: [facility].  Pt states that she has dryness in her groin area.  Personal health questionnaire reviewed: yes.   Gynecologic History No LMP recorded. Patient has had a hysterectomy. Contraception: status post hysterectomy Last mammogram: 05/2013. Results were: normal   The following portions of the patient's history were reviewed and updated as appropriate: allergies, current medications, past family history, past medical history, past social history, past surgical history and problem list.  Review of Systems Pertinent items are noted in HPI.   Objective:    BP 126/81  Pulse 90  Temp(Src) 98.1 F (36.7 C)  Ht 5\' 8"  (1.727 m)  Wt 247 lb (112.038 kg)  BMI 37.56 kg/m2  General Appearance:    Alert, cooperative, no distress, appears stated age  Breast Exam:    No tenderness, ill-defined mass--LUO quadrant  Abdomen:     Soft, non-tender, bowel sounds active all four quadrants,    no masses, no organomegaly  Genitalia:    Normal female without lesion, discharge or tenderness   Assessment:   Healthy female exam.  Stable breast exam Plan:   Follow up as needed.

## 2013-10-06 NOTE — Patient Instructions (Signed)

## 2013-11-10 ENCOUNTER — Ambulatory Visit: Payer: 59 | Admitting: Obstetrics & Gynecology

## 2014-03-16 ENCOUNTER — Encounter: Payer: Self-pay | Admitting: Obstetrics & Gynecology

## 2014-03-16 ENCOUNTER — Ambulatory Visit (INDEPENDENT_AMBULATORY_CARE_PROVIDER_SITE_OTHER): Payer: BC Managed Care – PPO | Admitting: Obstetrics & Gynecology

## 2014-03-16 VITALS — BP 156/82 | HR 82 | Temp 97.6°F | Ht 68.0 in | Wt 244.0 lb

## 2014-03-16 DIAGNOSIS — N760 Acute vaginitis: Secondary | ICD-10-CM

## 2014-03-17 ENCOUNTER — Telehealth: Payer: Self-pay | Admitting: *Deleted

## 2014-03-17 LAB — WET PREP BY MOLECULAR PROBE
CANDIDA SPECIES: NEGATIVE
Gardnerella vaginalis: NEGATIVE
TRICHOMONAS VAG: NEGATIVE

## 2014-03-17 NOTE — Telephone Encounter (Signed)
Pt placed call to office requesting Diflucan Rx. Pt states on message that she has been using Monistat OTC. Return call to pt. No answer, LM on VM making pt aware that labs at visit were normal.  Pt advised to contact office in order to discuss concerns.

## 2014-03-22 DIAGNOSIS — I10 Essential (primary) hypertension: Secondary | ICD-10-CM | POA: Insufficient documentation

## 2014-03-22 LAB — POCT WET PREP (WET MOUNT)
CLUE CELLS WET PREP WHIFF POC: NEGATIVE
KOH Wet Prep POC: NEGATIVE

## 2014-03-22 NOTE — Patient Instructions (Signed)

## 2014-03-22 NOTE — Progress Notes (Signed)
Patient ID: Dawn DrossCynthia G Stanger, female   DOB: [birth year], 48 y.o.   MRN: [id number]  Chief Complaint  Patient presents with  . problem    Possible Infection     HPI Dawn DrossCynthia G Schaff is a 48 y.o. female.   Vaginal Itching The patient's primary symptoms include genital itching. This is a new problem. The current episode started 1 to 4 weeks ago. The problem occurs constantly. The problem has been unchanged. The patient is experiencing no pain. The problem affects both sides. She is not pregnant. Pertinent negatives include no rash. Nothing aggravates the symptoms. She has tried antifungals for the symptoms. The treatment provided moderate relief.    Past Medical History  Diagnosis Date  . Hypertension     Past Surgical History  Procedure Laterality Date  . Abdominal hysterectomy    . Myomecotomy      History reviewed. No pertinent family history.  Social History History  Substance Use Topics  . Smoking status: Never Smoker   . Smokeless tobacco: Never Used  . Alcohol Use: Yes     Comment: occasional    Allergies  Allergen Reactions  . Sulfa Antibiotics     Current Outpatient Prescriptions  Medication Sig Dispense Refill  . ibuprofen (ADVIL,MOTRIN) 800 MG tablet Take 800 mg by mouth every 8 (eight) hours as needed.      Marland Kitchen. lisinopril-hydrochlorothiazide (PRINZIDE,ZESTORETIC) 20-12.5 MG per tablet Take 1 tablet by mouth daily.      . meclizine (ANTIVERT) 25 MG tablet        No current facility-administered medications for this visit.    Review of Systems Review of Systems  Skin: Negative for rash.   Constitutional: negative for fatigue and weight loss Respiratory: negative for cough and wheezing Cardiovascular: negative for chest pain, fatigue and palpitations Gastrointestinal: negative for abdominal pain and change in bowel habits Integument/breast: negative for nipple discharge Musculoskeletal:negative for myalgias Neurological: negative for gait problems and  tremors Behavioral/Psych: negative for abusive relationship, depression Endocrine: negative for temperature intolerance     Blood pressure 156/82, pulse 82, temperature 97.6 F (36.4 C), height 5\' 8"  (1.727 m), weight 110.678 kg (244 lb).  Physical Exam Physical Exam General:   alert  Skin:   no rash or abnormalities  Lungs:   clear to auscultation bilaterally  Heart:   regular rate and rhythm, S1, S2 normal, no murmur, click, rub or gallop  Abdomen:  normal findings: no organomegaly, soft, non-tender and no hernia  Pelvis:  External genitalia: normal general appearance Urinary system: urethral meatus normal and bladder without fullness, nontender Vaginal: scant, white discharge Cervix: normal appearance Adnexa: normal bimanual exam Uterus: anteverted and non-tender, normal size      Data Reviewed None  Assessment    Vulvovaginitis, likely candida    Plan    Orders Placed This Encounter  Procedures  . WET PREP BY MOLECULAR PROBE  Complete OTC treatment for candida Follow up as needed.        JACKSON-MOORE,Payton Moder A 03/22/2014, 10:25 AM

## 2014-03-30 NOTE — Telephone Encounter (Signed)
No response from patient- re file call.

## 2014-04-05 ENCOUNTER — Other Ambulatory Visit: Payer: Self-pay | Admitting: Internal Medicine

## 2014-04-05 DIAGNOSIS — N644 Mastodynia: Secondary | ICD-10-CM

## 2014-04-05 DIAGNOSIS — N63 Unspecified lump in unspecified breast: Secondary | ICD-10-CM

## 2014-04-13 ENCOUNTER — Ambulatory Visit
Admission: RE | Admit: 2014-04-13 | Discharge: 2014-04-13 | Disposition: A | Discharge status: Home / Self Care | Payer: BC Managed Care – PPO | Source: Ambulatory Visit | Attending: Internal Medicine | Admitting: Internal Medicine

## 2014-04-13 ENCOUNTER — Other Ambulatory Visit: Payer: Self-pay | Admitting: Internal Medicine

## 2014-04-13 DIAGNOSIS — N63 Unspecified lump in unspecified breast: Secondary | ICD-10-CM

## 2014-04-13 DIAGNOSIS — N644 Mastodynia: Secondary | ICD-10-CM

## 2014-06-20 ENCOUNTER — Encounter: Payer: Self-pay | Admitting: Obstetrics & Gynecology

## 2014-06-21 ENCOUNTER — Other Ambulatory Visit: Payer: Self-pay

## 2014-06-21 DIAGNOSIS — Z1231 Encounter for screening mammogram for malignant neoplasm of breast: Secondary | ICD-10-CM

## 2014-06-22 ENCOUNTER — Encounter (INDEPENDENT_AMBULATORY_CARE_PROVIDER_SITE_OTHER): Payer: Self-pay

## 2014-06-22 ENCOUNTER — Ambulatory Visit
Admission: RE | Admit: 2014-06-22 | Discharge: 2014-06-22 | Disposition: A | Discharge status: Home / Self Care | Payer: BC Managed Care – PPO | Source: Ambulatory Visit

## 2014-06-22 DIAGNOSIS — Z1231 Encounter for screening mammogram for malignant neoplasm of breast: Secondary | ICD-10-CM

## 2014-08-15 ENCOUNTER — Encounter: Payer: Self-pay | Admitting: *Deleted

## 2014-08-16 ENCOUNTER — Encounter: Payer: Self-pay | Admitting: Obstetrics & Gynecology

## 2015-06-13 ENCOUNTER — Encounter: Payer: Self-pay | Admitting: Internal Medicine

## 2015-06-13 ENCOUNTER — Ambulatory Visit (INDEPENDENT_AMBULATORY_CARE_PROVIDER_SITE_OTHER): Payer: Self-pay | Admitting: Internal Medicine

## 2015-06-13 VITALS — BP 142/88 | HR 82 | Ht 67.5 in | Wt 249.0 lb

## 2015-06-13 DIAGNOSIS — I10 Essential (primary) hypertension: Secondary | ICD-10-CM

## 2015-06-13 MED ORDER — LISINOPRIL-HYDROCHLOROTHIAZIDE 20-12.5 MG PO TABS: 1.0000 | ORAL_TABLET | Freq: Every day | ORAL | Status: DC

## 2015-06-13 NOTE — Progress Notes (Signed)
   Subjective:    Patient ID: Dawn Lara, female    DOB: Nov 14, 1965, 49 y.o.   MRN: [id number]  HPI  1.  Hypertension:  Diagnosed about 20 years ago.  Has been well controlled with medication.  Did have kidney involvement with SLE in past, but that has basically been in remission since 1990.  Does have family history of htn as well.   Took last dose of Lisinopril/HCTZ today, though has been stretching out last bottle.   Has not had any bloodwork done in 1 year.  Health Maintenance:  Would like to get set up for a complete physical.  Had a mammogram last year.  Scar tissue from a breast reduction complicates her mammograms All paps have been normal in past.  Last pap was 2 years ago.  Has had TAH in past as well. Colonoscopy done age 49 yo for BRBPR.    Review of Systems     Objective:   Physical Exam HEENT:  PERRL, EOMI,  Neck:  Supple, no adenopathy, no thyromegaly Chest:  CTA CV:  RRR with normal S1 and S2, No S3, S4, or murmur appreciated.  Radial pulses normal and equal. Extrems:  No edema       Assessment & Plan:  1.  Hypertension:  Not at goal, but stretching out meds.  Check BMet.  Refill Lisinopril/HCTZ.

## 2015-06-14 LAB — BASIC METABOLIC PANEL
BUN/Creatinine Ratio: 10 (ref 9–23)
BUN: 12 mg/dL (ref 6–24)
CO2: 25 mmol/L (ref 18–29)
Calcium: 9.9 mg/dL (ref 8.7–10.2)
Chloride: 98 mmol/L (ref 97–106)
Creatinine, Ser: 1.23 mg/dL — ABNORMAL HIGH (ref 0.57–1.00)
GFR calc Af Amer: 59 mL/min/{1.73_m2} — ABNORMAL LOW (ref >59)
GFR calc non Af Amer: 51 mL/min/{1.73_m2} — ABNORMAL LOW (ref >59)
GLUCOSE: 83 mg/dL (ref 65–99)
Potassium: 4.4 mmol/L (ref 3.5–5.2)
SODIUM: 139 mmol/L (ref 136–144)

## 2015-07-03 ENCOUNTER — Ambulatory Visit: Payer: Self-pay

## 2015-07-03 DIAGNOSIS — R7989 Other specified abnormal findings of blood chemistry: Secondary | ICD-10-CM

## 2015-07-04 LAB — MICROSCOPIC EXAMINATION: CASTS: NONE SEEN /LPF

## 2015-07-04 LAB — URINALYSIS, COMPLETE
BILIRUBIN UA: NEGATIVE
Glucose, UA: NEGATIVE
Ketones, UA: NEGATIVE
Nitrite, UA: NEGATIVE
PH UA: 6 (ref 5.0–7.5)
Protein, UA: NEGATIVE
RBC UA: NEGATIVE
SPEC GRAV UA: 1.016 (ref 1.005–1.030)
UUROB: 0.2 mg/dL (ref 0.2–1.0)

## 2015-07-12 ENCOUNTER — Other Ambulatory Visit: Payer: Self-pay

## 2015-07-12 DIAGNOSIS — R7989 Other specified abnormal findings of blood chemistry: Secondary | ICD-10-CM

## 2015-07-13 LAB — BASIC METABOLIC PANEL
BUN/Creatinine Ratio: 12 (ref 9–23)
BUN: 10 mg/dL (ref 6–24)
CALCIUM: 9.6 mg/dL (ref 8.7–10.2)
CO2: 25 mmol/L (ref 18–29)
CREATININE: 0.84 mg/dL (ref 0.57–1.00)
Chloride: 101 mmol/L (ref 97–106)
GFR calc Af Amer: 94 mL/min/{1.73_m2} (ref >59)
GFR calc non Af Amer: 81 mL/min/{1.73_m2} (ref >59)
GLUCOSE: 89 mg/dL (ref 65–99)
Potassium: 4.2 mmol/L (ref 3.5–5.2)
SODIUM: 140 mmol/L (ref 136–144)

## 2015-09-12 ENCOUNTER — Ambulatory Visit (INDEPENDENT_AMBULATORY_CARE_PROVIDER_SITE_OTHER): Payer: Self-pay | Admitting: Internal Medicine

## 2015-09-12 ENCOUNTER — Encounter: Payer: Self-pay | Admitting: Internal Medicine

## 2015-09-12 ENCOUNTER — Telehealth (HOSPITAL_COMMUNITY): Payer: Self-pay | Admitting: *Deleted

## 2015-09-12 VITALS — BP 138/76 | HR 70 | Ht 67.0 in | Wt 250.0 lb

## 2015-09-12 DIAGNOSIS — N6320 Unspecified lump in the left breast, unspecified quadrant: Secondary | ICD-10-CM

## 2015-09-12 DIAGNOSIS — N63 Unspecified lump in breast: Secondary | ICD-10-CM

## 2015-09-12 DIAGNOSIS — E785 Hyperlipidemia, unspecified: Secondary | ICD-10-CM

## 2015-09-12 DIAGNOSIS — Z79899 Other long term (current) drug therapy: Secondary | ICD-10-CM

## 2015-09-12 NOTE — Progress Notes (Signed)
Subjective:    Patient ID: Dawn Lara, female    DOB: 1966-05-10, 50 y.o.   MRN: [id number]  HPI  Here for CPE  Health Maintenance:  1.  Immunizations:  Had Td, uncertain type, she believes in 2008.  Has not received flu vaccine in past 2 years.  2.  Pap smears:  Always normal in past.  Had TAH in 2011.  3.  Mammogram:  Did not get mammogram last year.  Has difficulties with breast reduction scar in left breast causing difficulties with mammogram.    4.  Self Breast Exam:  Occasional  5.  Colonoscopy:  Pt. Believes in 2008.  Was done for BRBPR.  Colonoscopy was normal then.  Dr. Loreta Ave was her GI physician.   Current outpatient prescriptions:  .  lisinopril-hydrochlorothiazide (PRINZIDE,ZESTORETIC) 20-12.5 MG tablet, Take 1 tablet by mouth daily., Disp: 30 tablet, Rfl: 11 .  ibuprofen (ADVIL,MOTRIN) 800 MG tablet, Take 800 mg by mouth every 8 (eight) hours as needed., Disp: , Rfl:  .  meclizine (ANTIVERT) 25 MG tablet, Reported on 09/12/2015, Disp: , Rfl:   Allergies  Allergen Reactions  . Sulfa Antibiotics Itching   Past Medical History  Diagnosis Date  . Hypertension 2000    Well controlled on medication  . SLE (systemic lupus erythematosus) (HCC) 1986    Treated with prednisone and cyclosporine.  In remission by 1990.  Did have kidney/joint involvement, though states has had a short flare up in past 12 years.  Flares treated with prednisone for 7-10 days.  No longer followed by a Rheumatologist.  . Mnire's disease in remission 2005    Involving Right ear only--wore a hearing aide at one point, but not a problem now.   Past Surgical History  Procedure Laterality Date  . Myomecotomy      x 2 in past prior to hysterectomy  . Abdominal hysterectomy  2011    TAH:  done for recurrent fibroids  . Breast reduction surgery Bilateral 2009  . Breast biopsy Left 2010    Scar tissue from breast reduction surgery  . Colonoscopy  2008?   Family History  Problem Relation  Age of Onset  . Hyperlipidemia Mother   . Asthma Mother   . Diabetes Father   . Heart disease Father     Several MIs  . Hypertension Father   . Hypertension Brother    Social History   Social History  . Marital Status: Divorced    Spouse Name: N/A  . Number of Children: 0  . Years of Education: N/A   Occupational History  . Advertising account planner    Social History Main Topics  . Smoking status: Never Smoker   . Smokeless tobacco: Never Used  . Alcohol Use: 0.6 oz/week    1 Standard drinks or equivalent per week     Comment: occasional  . Drug Use: No  . Sexual Activity: Yes    Birth Control/ Protection: Surgical   Other Topics Concern  . Not on file   Social History Narrative   Originally from [place]   Has been living in [place] for 12 years.   College grad--[employer] in [place]      Review of Systems  Constitutional: Negative for fever and fatigue.  HENT: Negative for congestion and dental problem.        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  In remission now.  Eyes: Negative for visual disturbance.  Bifocals  Respiratory: Negative for cough and shortness of breath.   Cardiovascular: Positive for leg swelling. Negative for chest pain and palpitations.       Mild lower extrem swelling if on feet long periods of time.  HCTZ helps  Gastrointestinal: Negative for abdominal pain, diarrhea, constipation and blood in stool.  Endocrine: Negative for polydipsia and polyuria.  Genitourinary: Negative for dysuria, frequency, hematuria, vaginal bleeding and vaginal discharge.  Musculoskeletal:       Right elbow hurting last several days.  Points to lateral epicondylar area.  Seems to bother her in all motions.  Does not recall injury.  Ibuprofen helping. Has been studying for a class and was writing out lots of index cards with right hand/arm  Allergic/Immunologic: Negative for environmental allergies and food allergies.  Neurological:  Negative for dizziness, weakness and numbness.  Hematological: Negative for adenopathy.  Psychiatric/Behavioral: Negative for sleep disturbance and dysphoric mood. The patient is not nervous/anxious.        Objective:   Physical Exam  Constitutional: She is oriented to person, place, and time. She appears well-developed.  HENT:  Head: Normocephalic.  Right Ear: Tympanic membrane and ear canal normal.  Left Ear: Tympanic membrane and ear canal normal.  Nose: Nose normal. Right sinus exhibits no maxillary sinus tenderness and no frontal sinus tenderness. Left sinus exhibits no maxillary sinus tenderness and no frontal sinus tenderness.  Mouth/Throat: Oropharynx is clear and moist and mucous membranes are normal. Normal dentition. No oropharyngeal exudate.  Eyes: Conjunctivae and EOM are normal. Pupils are equal, round, and reactive to light.  Discs sharp bilaterally  Neck: Normal range of motion. Neck supple. No thyroid mass and no thyromegaly present.  Cardiovascular: Normal rate, regular rhythm, S1 normal and S2 normal.  Exam reveals no S3, no S4 and no friction rub.   No murmur heard. No carotid bruits.  Carotid, radial, femoral, DP and PT pulses normal and equal.  Pulmonary/Chest: Effort normal and breath sounds normal.  Breasts:   Right breast without focal mass, skin dimpling, nipple discharge or axillary adenopathy.  Left breast with large elliptical mass arranged horizontally in superior aspect about 3 cm above areola. Somewhat fixed.  Pt. States was evaluated in past and found to be scar tissue from breast reduction and feels unchanged to her.  No skin dimpling, nipple discharge or axillary adenopathy on  Left as well    Abdominal: Soft. Bowel sounds are normal. She exhibits no mass. There is no hepatosplenomegaly. There is no tenderness.  Genitourinary:  Genitalia: External genitalia normal without lesion.  No vaginal lesions or discharge. Vaginal mucosa moist.  No adnexal mass or  tenderness.  Rectal:  No mass,good anal sphincter tone, heme negative light brown stool.    Musculoskeletal: Normal range of motion.  Tenderness over tendon insertion on lateral epicondyle.  Full ROM and strength with use of elbow.  No swelling or erythema  Lymphadenopathy:    She has no cervical adenopathy.  Neurological: She is alert and oriented to person, place, and time. She has normal reflexes. No cranial nerve deficit. Coordination normal.  Skin: Skin is warm and dry.        Assessment & Plan:  1.  CPE:   Mammogram scholarship Hemoccult cards x3.  Due for repeat colonoscopy in 2018 Encouraged yearly flu vaccines Tdap?  2018 FLP, CMP, CBC today.  2. History Hyperlipidemia:  FLP, CMP  3.  Right Lateral Epicondylitis:  Discussed Ibuprofen twice daily for 2 weeks 400-800 mg  each dose with food as well as velcro armsplint.  To avoid movements that seem to make this worse.  4.  Left Breast Mass:  Mammogram as above

## 2015-09-12 NOTE — Telephone Encounter (Signed)
Telephoned patient at mobile # and left message to return call to [facility] 

## 2015-09-12 NOTE — Patient Instructions (Addendum)
Drink a glass of water before every meal Drink 6-8 glasses of water daily Eat three meals daily Eat a protein and healthy fat with every meal (eggs,fish, chicken, [place] and limit red meats) Eat 5 servings of vegetables daily, mix the colors Eat 2 servings of fruit daily with skin, if skin is edible Use smaller plates Put food/utensils down as you chew and swallow each bite Eat at a table with friends/family at least once daily, no TV Do not eat in front of the TV  Please get signed up for orange card ASAP

## 2015-09-13 LAB — CBC WITH DIFFERENTIAL/PLATELET
BASOS ABS: 0 10*3/uL (ref 0.0–0.2)
Basos: 0 %
EOS (ABSOLUTE): 0.1 10*3/uL (ref 0.0–0.4)
EOS: 2 %
HEMATOCRIT: 40.1 % (ref 34.0–46.6)
Hemoglobin: 13.5 g/dL (ref 11.1–15.9)
Immature Grans (Abs): 0 10*3/uL (ref 0.0–0.1)
Immature Granulocytes: 0 %
LYMPHS ABS: 1.8 10*3/uL (ref 0.7–3.1)
LYMPHS: 25 %
MCH: 28.9 pg (ref 26.6–33.0)
MCHC: 33.7 g/dL (ref 31.5–35.7)
MCV: 86 fL (ref 79–97)
MONOCYTES: 8 %
MONOS ABS: 0.5 10*3/uL (ref 0.1–0.9)
NEUTROS PCT: 65 %
Neutrophils Absolute: 4.6 10*3/uL (ref 1.4–7.0)
Platelets: 312 10*3/uL (ref 150–379)
RBC: 4.67 x10E6/uL (ref 3.77–5.28)
RDW: 15.8 % — AB (ref 12.3–15.4)
WBC: 7.1 10*3/uL (ref 3.4–10.8)

## 2015-09-13 LAB — COMPREHENSIVE METABOLIC PANEL
A/G RATIO: 1.4 (ref 1.1–2.5)
ALT: 18 IU/L (ref 0–32)
AST: 21 IU/L (ref 0–40)
Albumin: 3.9 g/dL (ref 3.5–5.5)
Alkaline Phosphatase: 62 IU/L (ref 39–117)
BUN/Creatinine Ratio: 11 (ref 9–23)
BUN: 10 mg/dL (ref 6–24)
Bilirubin Total: 0.3 mg/dL (ref 0.0–1.2)
CO2: 22 mmol/L (ref 18–29)
CREATININE: 0.9 mg/dL (ref 0.57–1.00)
Calcium: 9.6 mg/dL (ref 8.7–10.2)
Chloride: 101 mmol/L (ref 96–106)
GFR, EST AFRICAN AMERICAN: 86 mL/min/{1.73_m2} (ref >59)
GFR, EST NON AFRICAN AMERICAN: 75 mL/min/{1.73_m2} (ref >59)
Globulin, Total: 2.7 g/dL (ref 1.5–4.5)
Glucose: 78 mg/dL (ref 65–99)
Potassium: 4.3 mmol/L (ref 3.5–5.2)
Sodium: 139 mmol/L (ref 134–144)
TOTAL PROTEIN: 6.6 g/dL (ref 6.0–8.5)

## 2015-09-13 LAB — LIPID PANEL W/O CHOL/HDL RATIO
CHOLESTEROL TOTAL: 242 mg/dL — AB (ref 100–199)
HDL: 35 mg/dL — ABNORMAL LOW (ref >39)
LDL Calculated: 160 mg/dL — ABNORMAL HIGH (ref 0–99)
TRIGLYCERIDES: 236 mg/dL — AB (ref 0–149)
VLDL Cholesterol Cal: 47 mg/dL — ABNORMAL HIGH (ref 5–40)

## 2015-09-26 ENCOUNTER — Telehealth (HOSPITAL_COMMUNITY): Payer: Self-pay | Admitting: *Deleted

## 2015-09-26 NOTE — Telephone Encounter (Signed)
Telephoned patient at home # and left message to return call to [facility] 

## 2015-10-06 ENCOUNTER — Other Ambulatory Visit: Payer: Self-pay | Admitting: Internal Medicine

## 2015-10-06 DIAGNOSIS — N6320 Unspecified lump in the left breast, unspecified quadrant: Secondary | ICD-10-CM

## 2015-10-20 ENCOUNTER — Inpatient Hospital Stay: Admission: RE | Admit: 2015-10-20 | Payer: Self-pay | Source: Ambulatory Visit

## 2015-10-20 ENCOUNTER — Other Ambulatory Visit: Payer: Self-pay

## 2015-11-12 ENCOUNTER — Encounter: Payer: Self-pay | Admitting: Internal Medicine

## 2015-12-11 ENCOUNTER — Ambulatory Visit: Payer: Self-pay | Admitting: Internal Medicine

## 2016-02-23 ENCOUNTER — Ambulatory Visit: Payer: Self-pay | Admitting: Internal Medicine

## 2016-11-17 DIAGNOSIS — I639 Cerebral infarction, unspecified: Secondary | ICD-10-CM

## 2016-11-17 HISTORY — DX: Cerebral infarction, unspecified: I63.9

## 2020-09-12 ENCOUNTER — Other Ambulatory Visit: Payer: Self-pay | Admitting: *Deleted

## 2020-09-12 ENCOUNTER — Other Ambulatory Visit: Payer: Self-pay | Admitting: Internal Medicine

## 2020-09-12 DIAGNOSIS — Z1231 Encounter for screening mammogram for malignant neoplasm of breast: Secondary | ICD-10-CM

## 2020-09-28 ENCOUNTER — Ambulatory Visit: Payer: Self-pay | Admitting: Obstetrics

## 2020-09-28 ENCOUNTER — Encounter: Payer: Self-pay | Admitting: Obstetrics

## 2020-09-28 ENCOUNTER — Ambulatory Visit (INDEPENDENT_AMBULATORY_CARE_PROVIDER_SITE_OTHER): Payer: No Typology Code available for payment source | Admitting: Obstetrics

## 2020-09-28 ENCOUNTER — Other Ambulatory Visit: Payer: Self-pay

## 2020-09-28 VITALS — BP 161/85 | HR 74 | Ht 67.0 in | Wt 247.0 lb

## 2020-09-28 DIAGNOSIS — Z01419 Encounter for gynecological examination (general) (routine) without abnormal findings: Secondary | ICD-10-CM

## 2020-09-28 DIAGNOSIS — E669 Obesity, unspecified: Secondary | ICD-10-CM

## 2020-09-28 NOTE — Progress Notes (Signed)
Patient presents for New Patient AEX. Patient complains of having vaginal dryness and using Replens with relief.   Last pap: Patient has a hysterectomy Last MM: Last year in [place]. Patient states was normal. Next MM scheduled in 2 weeks with the breast center

## 2020-09-28 NOTE — Progress Notes (Signed)
Subjective:        Dawn Lara is a 55 y.o. female here for a routine exam.  Current complaints: None.    Personal health questionnaire:  Is patient Ashkenazi Jewish, have a family history of breast and/or ovarian cancer: no Is there a family history of uterine cancer diagnosed at age < 40, gastrointestinal cancer, urinary tract cancer, family member who is a Personnel officer syndrome-associated carrier: no Is the patient overweight and hypertensive, family history of diabetes, personal history of gestational diabetes, preeclampsia or PCOS: no Is patient over 39, have PCOS,  family history of premature CHD under age 52, diabetes, smoke, have hypertension or peripheral artery disease:  no At any time, has a partner hit, kicked or otherwise hurt or frightened you?: no Over the past 2 weeks, have you felt down, depressed or hopeless?: no Over the past 2 weeks, have you felt little interest or pleasure in doing things?:no   Gynecologic History No LMP recorded. Patient has had a hysterectomy. Contraception: status post hysterectomy Last Pap: 2011. Results were: normal Last mammogram: 2021. Results were: normal  Obstetric History OB History  Gravida Para Term Preterm AB Living  3       3 0  SAB IAB Ectopic Multiple Live Births    2 1   0    # Outcome Date GA Lbr Len/2nd Weight Sex Delivery Anes PTL Lv  3 IAB           2 IAB           1 Ectopic             Past Medical History:  Diagnosis Date  . Hypertension 2000   Well controlled on medication  . Mnire's disease in remission 2005   Involving Right ear only--wore a hearing aide at one point, but not a problem now.  . SLE (systemic lupus erythematosus) (HCC) 1986   Treated with prednisone and cyclosporine.  In remission by 1990.  Did have kidney/joint involvement, though states has had a short flare up in past 12 years.  Flares treated with prednisone for 7-10 days.  No longer followed by a Rheumatologist.  . Stroke (HCC) 11/2016     Past Surgical History:  Procedure Laterality Date  . ABDOMINAL HYSTERECTOMY  2011   TAH:  done for recurrent fibroids  . BREAST BIOPSY Left 2010   Scar tissue from breast reduction surgery  . BREAST REDUCTION SURGERY Bilateral 2009  . COLONOSCOPY  2008?  Marland Kitchen myomecotomy     x 2 in past prior to hysterectomy     Current Outpatient Medications:  .  amLODipine (NORVASC) 2.5 MG tablet, Take 2.5 mg by mouth daily., Disp: , Rfl:  .  ASPIRIN LOW DOSE 81 MG EC tablet, Take 81 mg by mouth daily., Disp: , Rfl:  .  atorvastatin (LIPITOR) 40 MG tablet, Take 40 mg by mouth daily., Disp: , Rfl:  .  cetirizine (ZYRTEC) 10 MG tablet, Take 10 mg by mouth daily., Disp: , Rfl:  .  fluticasone (FLONASE) 50 MCG/ACT nasal spray, Place into both nostrils daily., Disp: , Rfl:  .  lisinopril (ZESTRIL) 30 MG tablet, Take 30 mg by mouth daily., Disp: , Rfl:  .  lisinopril-hydrochlorothiazide (PRINZIDE,ZESTORETIC) 20-12.5 MG tablet, Take 1 tablet by mouth daily., Disp: 30 tablet, Rfl: 11 Allergies  Allergen Reactions  . Sulfa Antibiotics Itching    Social History   Tobacco Use  . Smoking status: Never Smoker  . Smokeless tobacco:  Never Used  Substance Use Topics  . Alcohol use: Yes    Alcohol/week: 1.0 standard drink    Types: 1 Standard drinks or equivalent per week    Comment: occasional    Family History  Problem Relation Age of Onset  . Hyperlipidemia Mother   . Asthma Mother   . Diabetes Father   . Heart disease Father        Several MIs  . Hypertension Father   . Hypertension Brother   . Breast cancer Paternal Grandmother       Review of Systems  Constitutional: negative for fatigue and weight loss Respiratory: negative for cough and wheezing Cardiovascular: negative for chest pain, fatigue and palpitations Gastrointestinal: negative for abdominal pain and change in bowel habits Musculoskeletal:negative for myalgias Neurological: negative for gait problems and  tremors Behavioral/Psych: negative for abusive relationship, depression Endocrine: negative for temperature intolerance    Genitourinary:negative for abnormal menstrual periods, genital lesions, hot flashes, sexual problems and vaginal discharge Integument/breast: negative for breast lump, breast tenderness, nipple discharge and skin lesion(s)    Objective:       BP (!) 161/85   Pulse 74   Ht 5\' 7"  (1.702 m)   Wt 247 lb (112 kg)   BMI 38.69 kg/m  General:   alert and no distress  Skin:   no rash or abnormalities  Lungs:   clear to auscultation bilaterally  Heart:   regular rate and rhythm, S1, S2 normal, no murmur, click, rub or gallop  Breasts:   normal without suspicious masses, skin or nipple changes or axillary nodes  Abdomen:  normal findings: no organomegaly, soft, non-tender and no hernia  Pelvis:  External genitalia: normal general appearance Urinary system: urethral meatus normal and bladder without fullness, nontender Vaginal: normal without tenderness, induration or masses Cervix: absent Adnexa: normal bimanual exam Uterus: absent   Lab Review Urine pregnancy test Labs reviewed yes Radiologic studies reviewed yes  50% of 20 min visit spent on counseling and coordination of care.   Assessment:     1. Encounter for annual routine gynecological examination - doing well  2. Obesity (BMI 35.0-39.9 without comorbidity) - program of caloric reduction, exercise and behavioral modification recommended    Plan:    Education reviewed: calcium supplements, depression evaluation, low fat, low cholesterol diet, safe sex/STD prevention, self breast exams and weight bearing exercise. Mammogram ordered. Follow up in: 2 years.    [birth year]1990, MD 09/28/2020 9:00 AM

## 2020-10-26 ENCOUNTER — Other Ambulatory Visit: Payer: Self-pay | Admitting: Internal Medicine

## 2020-10-26 ENCOUNTER — Other Ambulatory Visit: Payer: Self-pay

## 2020-10-26 ENCOUNTER — Other Ambulatory Visit: Payer: Self-pay | Admitting: Obstetrics

## 2020-10-26 ENCOUNTER — Ambulatory Visit
Admission: RE | Admit: 2020-10-26 | Discharge: 2020-10-26 | Disposition: A | Discharge status: Home / Self Care | Payer: No Typology Code available for payment source | Source: Ambulatory Visit | Attending: *Deleted | Admitting: *Deleted

## 2020-10-26 DIAGNOSIS — Z1231 Encounter for screening mammogram for malignant neoplasm of breast: Secondary | ICD-10-CM

## 2021-08-17 ENCOUNTER — Telehealth: Payer: Self-pay

## 2021-08-17 NOTE — Telephone Encounter (Signed)
Return call to pt regarding triage vm Pt requesting Rx for hot flashes and night sweats to be sent in by Dr.Harper  Pt made aware he is currently out of the office and she may want to f/u with primary care office or call office back when Dr.Harper returns if she only wants to see him or schedule appt w/diff provider here in the office.  Pt agreeable and voiced understanding And will call back when Dr.Harper is ava.

## 2021-11-02 ENCOUNTER — Other Ambulatory Visit: Payer: Self-pay | Admitting: Obstetrics

## 2021-11-02 DIAGNOSIS — Z1231 Encounter for screening mammogram for malignant neoplasm of breast: Secondary | ICD-10-CM

## 2021-11-12 ENCOUNTER — Ambulatory Visit
Admission: RE | Admit: 2021-11-12 | Discharge: 2021-11-12 | Disposition: A | Discharge status: Home / Self Care | Payer: Commercial Managed Care - PPO | Source: Ambulatory Visit | Attending: Obstetrics | Admitting: Obstetrics

## 2021-11-12 DIAGNOSIS — Z1231 Encounter for screening mammogram for malignant neoplasm of breast: Secondary | ICD-10-CM

## 2022-04-26 ENCOUNTER — Other Ambulatory Visit: Payer: Self-pay | Admitting: Family Medicine

## 2022-09-11 ENCOUNTER — Encounter: Payer: Commercial Managed Care - PPO | Attending: Student | Admitting: Dietician

## 2022-09-11 ENCOUNTER — Encounter: Payer: Self-pay | Admitting: Dietician

## 2022-09-11 VITALS — Ht 67.0 in | Wt 253.9 lb

## 2022-09-11 DIAGNOSIS — Z713 Dietary counseling and surveillance: Secondary | ICD-10-CM | POA: Diagnosis not present

## 2022-09-11 DIAGNOSIS — Z8673 Personal history of transient ischemic attack (TIA), and cerebral infarction without residual deficits: Secondary | ICD-10-CM | POA: Insufficient documentation

## 2022-09-11 DIAGNOSIS — E785 Hyperlipidemia, unspecified: Secondary | ICD-10-CM | POA: Diagnosis not present

## 2022-09-11 DIAGNOSIS — E669 Obesity, unspecified: Secondary | ICD-10-CM | POA: Diagnosis not present

## 2022-09-11 DIAGNOSIS — Z6839 Body mass index (BMI) 39.0-39.9, adult: Secondary | ICD-10-CM | POA: Diagnosis not present

## 2022-09-11 DIAGNOSIS — E782 Mixed hyperlipidemia: Secondary | ICD-10-CM

## 2022-09-11 DIAGNOSIS — I10 Essential (primary) hypertension: Secondary | ICD-10-CM | POA: Diagnosis present

## 2022-09-11 NOTE — Progress Notes (Signed)
Medical Nutrition Therapy: Visit start time: 0950  end time: 1050  Assessment:   Referral Diagnosis: hypertension, hx of CVA, hyperlipidemia, obesity Other medical history/ diagnoses: prediabetes Psychosocial issues/ stress concerns: none  Medications, supplements: reconciled list in medical record   Preferred learning method:  Auditory -- discussion and notes   Current weight: 253.9lbs with shoes, jacket Height: 5'7" BMI: 39.77 Patient's personal weight goal:190lbs  Progress and evaluation:  Pateint reports some decrease in inches since starting regular exercise several weeks ago. Practices intermittent fasting, 7pm - 11am  Lost 50lbs on keto diet but then regained when resuming regular diet. Parents live with patient and she prepares meals for them; no issues with conflicting diets/ preferences. Reports having CVA 2017 or 18, has been on cholesterol and BP meds since then Recent labs indicate HbA1C 6.1% (08/16/22), total cholesterol 179, triglycerides 192, HDL 46, LDL 94 (05/27/22) Food allergies: none Special diet practices: none Patient seeks help with weight loss and improved health risk    Dietary Intake:  Usual eating pattern includes 2 meals and 1 snacks per day. Dining out frequency: 2 meals per week. Who plans meals/ buys groceries? self Who prepares meals? self  Breakfast: 11/ 11:30am-- high protein -- omelet with veg ie spinach/ bell peppers in different colors; occ protein shake (Premier, sometimes blended with ice, rarely fruit) Snack: none Lunch: none Snack: nuts maybe lightly salted or unsalted pecans/ walnuts/ almonds Supper: 3:30-4:30 1/23 pork chop, string beans, baked sweet potato with cinn, butter, no sugar; chicken/ salmon/ fish/ steak palm size portions + veg avoids overcooking to preserve nutrients Snack: usually none Beverages: water, 64oz + daily; low sugar ashwagonda drink occasionally; sometimes 1/2- sweet tea when out;   Physical activity: walking,  resistance exercise 45 minutes, 3 a week   Intervention:   Nutrition Care Education:   Basic nutrition: basic food groups; appropriate nutrient balance; appropriate meal and snack schedule; general nutrition guidelines    Weight control: determining reasonable weight loss rate; importance of low sugar and low fat choices; appropriate food portions; estimated energy needs for weight loss at 1500-1600 kcal, provided guidance for 40% CHO, 30% protein, 30% fat per patient preference prediabetes:  appropriate meal and snack schedule; appropriate carb intake and balance, healthy carb choices; role of fiber, protein, fat; physical activity Hypertension:  identifying food sources of potassium, magnesium; options for seasoning foods Hyperlipidemia:  target goals for lipids; healthy and unhealthy fats; role of fiber, role of exercise; Mediterranean eating pattern   Other intervention notes: Patient has been making positive diet changes and is motivated to continue. She is focused on overall health vs restrictive dieting.  She is generally making low sodium food choices, is limiting processed foods and including generous amounts of vegetables along with some fruits, for BP control and overall health. Goal is to continue with current eating pattern, ensure adequate daily nutrition. No follow up scheduled at this time; patient will schedule later if needed.    Nutritional Diagnosis:  [place]-2.1 Inpaired nutrition utilization and [place]-2.2 Altered nutrition-related laboratory As related to HTN, hyperlipidemia, prediabetes.  As evidenced by BP and cholesterol controlled with medications and diet, recently elevated HbA1C. [place]-3.3 Overweight/obesity As related to history of excess calories and inadequate physical activity.  As evidenced by patient with current BMI of 39.77.   Education Materials given:  Museum/gallery conservator with food lists, sample meal pattern Get Healthy with Mediterranean Style Eating Snacking  handout Visit summary with goals/ instructions   Learner/ who was taught:  Patient    Level of understanding: Verbalizes/ demonstrates competency  Demonstrated degree of understanding via:   Teach back Learning barriers: None  Willingness to learn/ readiness for change: Eager, change in progress   Monitoring and Evaluation:  Dietary intake, exercise, BP and BG control, blood lipids, and body weight      follow up: prn

## 2022-09-11 NOTE — Patient Instructions (Signed)
Continue with healthy eating pattern, emphasizing low carb veggies and some fruits, lean protein foods, and some healthy carbs/ grains.  Keep eating a meal or snack every 3-4 hours during "eating" hours. OK to expand hours to 10 daily to meet needs and control hunger.  Keep up regular exercise, great job!

## 2022-10-01 ENCOUNTER — Other Ambulatory Visit: Payer: Self-pay | Admitting: Family Medicine

## 2022-10-01 DIAGNOSIS — Z1231 Encounter for screening mammogram for malignant neoplasm of breast: Secondary | ICD-10-CM

## 2022-10-24 ENCOUNTER — Ambulatory Visit: Payer: Commercial Managed Care - PPO | Admitting: Dietician

## 2022-11-14 ENCOUNTER — Ambulatory Visit
Admission: RE | Admit: 2022-11-14 | Discharge: 2022-11-14 | Disposition: A | Discharge status: Home / Self Care | Payer: Commercial Managed Care - PPO | Source: Ambulatory Visit | Attending: Family Medicine | Admitting: Family Medicine

## 2022-11-14 DIAGNOSIS — Z1231 Encounter for screening mammogram for malignant neoplasm of breast: Secondary | ICD-10-CM

## 2023-01-29 ENCOUNTER — Ambulatory Visit: Payer: Commercial Managed Care - PPO

## 2023-01-29 DIAGNOSIS — D120 Benign neoplasm of cecum: Secondary | ICD-10-CM

## 2023-01-29 DIAGNOSIS — D123 Benign neoplasm of transverse colon: Secondary | ICD-10-CM

## 2023-01-29 DIAGNOSIS — Z86010 Personal history of colonic polyps: Secondary | ICD-10-CM

## 2023-01-29 DIAGNOSIS — K5730 Diverticulosis of large intestine without perforation or abscess without bleeding: Secondary | ICD-10-CM

## 2023-01-29 DIAGNOSIS — Z1211 Encounter for screening for malignant neoplasm of colon: Secondary | ICD-10-CM

## 2023-01-29 DIAGNOSIS — K635 Polyp of colon: Secondary | ICD-10-CM

## 2023-02-05 LAB — COLONOSCOPY

## 2023-10-08 ENCOUNTER — Other Ambulatory Visit: Payer: Self-pay | Admitting: Family Medicine

## 2023-10-08 ENCOUNTER — Encounter: Payer: Self-pay | Admitting: Nurse Practitioner

## 2023-10-08 ENCOUNTER — Encounter: Payer: Self-pay | Admitting: Family Medicine

## 2023-10-08 DIAGNOSIS — Z1231 Encounter for screening mammogram for malignant neoplasm of breast: Secondary | ICD-10-CM

## 2023-10-23 ENCOUNTER — Ambulatory Visit: Payer: Self-pay | Admitting: Nurse Practitioner

## 2023-10-23 ENCOUNTER — Encounter: Payer: Self-pay | Admitting: Nurse Practitioner

## 2023-10-23 VITALS — BP 130/86 | HR 85 | Temp 98.3°F | Ht 67.0 in | Wt 247.0 lb

## 2023-10-23 DIAGNOSIS — R7303 Prediabetes: Secondary | ICD-10-CM

## 2023-10-23 DIAGNOSIS — I10 Essential (primary) hypertension: Secondary | ICD-10-CM

## 2023-10-23 DIAGNOSIS — N951 Menopausal and female climacteric states: Secondary | ICD-10-CM | POA: Diagnosis not present

## 2023-10-23 DIAGNOSIS — E6609 Other obesity due to excess calories: Secondary | ICD-10-CM

## 2023-10-23 DIAGNOSIS — E66812 Obesity, class 2: Secondary | ICD-10-CM

## 2023-10-23 DIAGNOSIS — Z7689 Persons encountering health services in other specified circumstances: Secondary | ICD-10-CM

## 2023-10-23 DIAGNOSIS — Z840 Family history of diseases of the skin and subcutaneous tissue: Secondary | ICD-10-CM

## 2023-10-23 DIAGNOSIS — R61 Generalized hyperhidrosis: Secondary | ICD-10-CM

## 2023-10-23 DIAGNOSIS — Z2821 Immunization not carried out because of patient refusal: Secondary | ICD-10-CM

## 2023-10-23 DIAGNOSIS — Z6838 Body mass index (BMI) 38.0-38.9, adult: Secondary | ICD-10-CM

## 2023-10-23 DIAGNOSIS — Z8673 Personal history of transient ischemic attack (TIA), and cerebral infarction without residual deficits: Secondary | ICD-10-CM

## 2023-10-23 MED ORDER — AMLODIPINE BESYLATE 2.5 MG PO TABS: 2.5000 mg | ORAL_TABLET | Freq: Every day | ORAL | 1 refills | Status: DC

## 2023-10-23 MED ORDER — LISINOPRIL 30 MG PO TABS: 30.0000 mg | ORAL_TABLET | Freq: Every day | ORAL | 1 refills | Status: DC

## 2023-10-23 NOTE — Patient Instructions (Addendum)
 Call your insurance company to see if they cover [place] for hot flashes without trying another medication.  You can download the app Myfitnesspal Goal to exercise 150 minutes per week with at least 2 days of strength training Encouraged to park further when at the store, take stairs instead of elevators and to walk in place during commercials. Increase water intake to at least one gallon of water daily.

## 2023-10-23 NOTE — Progress Notes (Signed)
 Madelaine Bhat, CMA,acting as a Neurosurgeon for Dawn Felts, FNP.,have documented all relevant documentation on the behalf of Dawn Felts, FNP,as directed by  Dawn Felts, FNP while in the presence of Dawn Felts, FNP.  Subjective:  Patient ID: Dawn Lara , female    DOB: 1965-09-15 , 58 y.o.   MRN: [id number]  Chief Complaint  Patient presents with   Establish Care    HPI  Patient presents today to establish care. She was a patient of Dr. Allyne Gee in 2017 and relocated to [place] and returned in 2021. Since returning she was seeing Dr. Terance Hart until they did not take her insurance in October. She is working with [employer]. Divorced. She does not have any children.   She was diagnosed in [place] when she was very young with Lupus, not taking any medications. She would have proteinuria and can tell by the odor of her urine. She was treated with prednisone and went away. She had a stroke while in [place]. She noticed her speech sounding weird and went to Urgent care then to ER. She had an MRI and CT scan. She was on a cholesterol medication due to it being high and her bp was elevated as well. She did see a neurologist for a while. She does take an ASA 81 mg daily over the counter.   Patient would like would like to discuss mostly night sweats and hot flashes every night for years but they are getting worse. She is wanting medications. She has taken Black Cohosh to help.  Patient reports the night sweats are worse then the hot flashes.   Patient would also like to start a weight loss medication. She has been taking seamoss for her joints for the last month. She is walking more.        Past Medical History:  Diagnosis Date   Hypertension 2000   Well controlled on medication   Mnire's disease in remission 2005   Involving Right ear only--wore a hearing aide at one point, but not a problem now.   SLE (systemic lupus erythematosus) (HCC) 1986   Treated with prednisone and cyclosporine.  In  remission by 1990.  Did have kidney/joint involvement, though states has had a short flare up in past 12 years.  Flares treated with prednisone for 7-10 days.  No longer followed by a Rheumatologist.   Stroke (HCC) 11/2016     Family History  Problem Relation Age of Onset   Hyperlipidemia Mother    Asthma Mother    Diabetes Father    Heart disease Father        Several MIs   Hypertension Father    Hypertension Brother    Breast cancer Paternal Grandmother     Current Outpatient Medications:    ASPIRIN LOW DOSE 81 MG EC tablet, Take 81 mg by mouth daily., Disp: , Rfl:    atorvastatin (LIPITOR) 40 MG tablet, Take 40 mg by mouth daily., Disp: , Rfl:    cetirizine (ZYRTEC) 10 MG tablet, Take 10 mg by mouth daily., Disp: , Rfl:    Cholecalciferol 50 MCG (2000 UT) CAPS, Take by mouth., Disp: , Rfl:    fluticasone (FLONASE) 50 MCG/ACT nasal spray, Place into both nostrils daily., Disp: , Rfl:    MAGNESIUM GLUCONATE PO, Take by mouth., Disp: , Rfl:    meloxicam (MOBIC) 15 MG tablet, Take 1 tablet by mouth daily as needed., Disp: , Rfl:    Multiple Vitamins-Minerals (MULTIVITAMIN ADULT, MINERALS,) TABS, Take 1  tablet by mouth daily. With CoQ 10 and Omega 3, Disp: , Rfl:    Probiotic Product (PROBIOTIC BLEND PO), Take by mouth., Disp: , Rfl:    amLODipine (NORVASC) 2.5 MG tablet, Take 1 tablet (2.5 mg total) by mouth daily., Disp: 90 tablet, Rfl: 1   gabapentin (NEURONTIN) 100 MG capsule, Take 1 capsule (100 mg total) by mouth at bedtime., Disp: 90 capsule, Rfl: 1   lisinopril (ZESTRIL) 30 MG tablet, Take 1 tablet (30 mg total) by mouth daily., Disp: 90 tablet, Rfl: 1   Allergies  Allergen Reactions   Sulfa Antibiotics Itching     Review of Systems  Constitutional: Negative.  Negative for fatigue.  Respiratory: Negative.    Cardiovascular: Negative.  Negative for chest pain and leg swelling.  Musculoskeletal: Negative.      Today's Vitals   10/23/23 1449 10/23/23 1538  BP: (!)  132/90 130/86  Pulse: 85   Temp: 98.3 F (36.8 C)   TempSrc: Oral   Weight: 247 lb (112 kg)   Height: 5\' 7"  (1.702 m)   PainSc: 3    PainLoc: Head    Body mass index is 38.69 kg/m.  Wt Readings from Last 3 Encounters:  10/23/23 247 lb (112 kg)  09/11/22 253 lb 14.4 oz (115.2 kg)  09/28/20 247 lb (112 kg)      Objective:  Physical Exam Vitals and nursing note reviewed.  Constitutional:      General: She is not in acute distress.    Appearance: Normal appearance. She is well-developed. She is obese.  Cardiovascular:     Rate and Rhythm: Normal rate and regular rhythm.     Pulses: Normal pulses.     Heart sounds: Normal heart sounds. No murmur heard. Pulmonary:     Effort: Pulmonary effort is normal. No respiratory distress.     Breath sounds: Normal breath sounds. No wheezing.  Chest:     Chest wall: No tenderness.  Musculoskeletal:        General: Normal range of motion.  Skin:    General: Skin is warm and dry.     Capillary Refill: Capillary refill takes less than 2 seconds.  Neurological:     General: No focal deficit present.     Mental Status: She is alert and oriented to person, place, and time.     Cranial Nerves: No cranial nerve deficit.  Psychiatric:        Mood and Affect: Mood normal.        Behavior: Behavior normal.        Thought Content: Thought content normal.        Judgment: Judgment normal.         Assessment And Plan:  Establishing care with new doctor, encounter for Assessment & Plan: Patient is here to establish care. Went over patient medical, family, social and surgical history. Reviewed with patient their medications and any allergies  Reviewed with patient their sexual orientation, drug/tobacco and alcohol use Dicussed any new concerns with patient  recommended patient comes in for a physical exam and complete blood work.  Educated patient about the importance of annual screenings and immunizations.  Advised patient to eat a  healthy diet along with exercise for atleast 30-45 min atleast 4-5 days of the week.      Primary hypertension Assessment & Plan: Blood pressure is fairly controlled, continue current medications. Will check eGFR  Orders: -     amLODIPine Besylate; Take 1 tablet (2.5 mg total)  by mouth daily.  Dispense: 90 tablet; Refill: 1 -     Lisinopril; Take 1 tablet (30 mg total) by mouth daily.  Dispense: 90 tablet; Refill: 1 -     BMP8+eGFR -     CMP14+EGFR  Hot flashes due to menopause Assessment & Plan: Will check for metabolic causes. Discussed options to include an SSRI as first line treatment at this time. She would need to try this prior to getting Veozah.    Prediabetes Assessment & Plan: Will check HgbA1c. No current medications.   Orders: -     Hemoglobin A1c -     CMP14+EGFR  Influenza vaccination declined Assessment & Plan: Patient declined influenza vaccination at this time. Patient is aware that influenza vaccine prevents illness in 70% of healthy people, and reduces hospitalizations to 30-70% in elderly. This vaccine is recommended annually. Education has been provided regarding the importance of this vaccine but patient still declined. Advised may receive this vaccine at local pharmacy or Health Dept.or vaccine clinic. Aware to provide a copy of the vaccination record if obtained from local pharmacy or Health Dept.  Pt is willing to accept risk associated with refusing vaccination.    COVID-19 vaccination declined Assessment & Plan: Declines covid 19 vaccine. Discussed risk of covid 17 and if she changes her mind about the vaccine to call the office. Education has been provided regarding the importance of this vaccine but patient still declined. Advised may receive this vaccine at local pharmacy or Health Dept.or vaccine clinic. Aware to provide a copy of the vaccination record if obtained from local pharmacy or Health Dept.  Encouraged to take multivitamin, vitamin d,  vitamin c and zinc to increase immune system. Aware can call office if would like to have vaccine here at office. Verbalized acceptance and understanding.    Tetanus, diphtheria, and acellular pertussis (Tdap) vaccination declined  Class 2 obesity due to excess calories with body mass index (BMI) of 38.0 to 38.9 in adult, unspecified whether serious comorbidity present Assessment & Plan: She is encouraged to strive for BMI less than 30 to decrease cardiac risk. Advised to aim for at least 150 minutes of exercise per week.     History of CVA (cerebrovascular accident)  Family history of lupus erythematosus    Return in about 4 months (around 02/22/2024) for phy when able.  Patient was given opportunity to ask questions. Patient verbalized understanding of the plan and was able to repeat key elements of the plan. All questions were answered to their satisfaction.    Jeanell Sparrow, FNP, have reviewed all documentation for this visit. The documentation on 10/23/23 for the exam, diagnosis, procedures, and orders are all accurate and complete.   IF YOU HAVE BEEN REFERRED TO A SPECIALIST, IT MAY TAKE 1-2 WEEKS TO SCHEDULE/PROCESS THE REFERRAL. IF YOU HAVE NOT HEARD FROM US/SPECIALIST IN TWO WEEKS, PLEASE GIVE [place] A CALL AT [phone].

## 2023-10-24 LAB — CMP14+EGFR
ALT: 23 IU/L (ref 0–32)
AST: 24 IU/L (ref 0–40)
Albumin: 4.2 g/dL (ref 3.8–4.9)
Alkaline Phosphatase: 117 IU/L (ref 44–121)
BUN/Creatinine Ratio: 12 (ref 9–23)
BUN: 12 mg/dL (ref 6–24)
Bilirubin Total: 0.2 mg/dL (ref 0.0–1.2)
CO2: 24 mmol/L (ref 20–29)
Calcium: 9.5 mg/dL (ref 8.7–10.2)
Chloride: 102 mmol/L (ref 96–106)
Creatinine, Ser: 1.01 mg/dL — ABNORMAL HIGH (ref 0.57–1.00)
Globulin, Total: 2.7 g/dL (ref 1.5–4.5)
Glucose: 83 mg/dL (ref 70–99)
Potassium: 4.5 mmol/L (ref 3.5–5.2)
Sodium: 139 mmol/L (ref 134–144)
Total Protein: 6.9 g/dL (ref 6.0–8.5)
eGFR: 65 mL/min/{1.73_m2} (ref >59)

## 2023-10-24 LAB — HEMOGLOBIN A1C
Est. average glucose Bld gHb Est-mCnc: 123 mg/dL
Hgb A1c MFr Bld: 5.9 % — ABNORMAL HIGH (ref 4.8–5.6)

## 2023-10-29 ENCOUNTER — Other Ambulatory Visit: Payer: Self-pay | Admitting: Nurse Practitioner

## 2023-10-29 MED ORDER — GABAPENTIN 100 MG PO CAPS: 100.0000 mg | ORAL_CAPSULE | Freq: Every day | ORAL | 1 refills | Status: DC

## 2023-11-10 DIAGNOSIS — E6609 Other obesity due to excess calories: Secondary | ICD-10-CM | POA: Insufficient documentation

## 2023-11-10 DIAGNOSIS — Z7689 Persons encountering health services in other specified circumstances: Secondary | ICD-10-CM | POA: Insufficient documentation

## 2023-11-10 DIAGNOSIS — R61 Generalized hyperhidrosis: Secondary | ICD-10-CM | POA: Insufficient documentation

## 2023-11-10 DIAGNOSIS — N951 Menopausal and female climacteric states: Secondary | ICD-10-CM | POA: Insufficient documentation

## 2023-11-10 DIAGNOSIS — Z8673 Personal history of transient ischemic attack (TIA), and cerebral infarction without residual deficits: Secondary | ICD-10-CM | POA: Insufficient documentation

## 2023-11-10 DIAGNOSIS — Z2821 Immunization not carried out because of patient refusal: Secondary | ICD-10-CM | POA: Insufficient documentation

## 2023-11-10 DIAGNOSIS — R7303 Prediabetes: Secondary | ICD-10-CM | POA: Insufficient documentation

## 2023-11-10 NOTE — Assessment & Plan Note (Signed)

## 2023-11-10 NOTE — Assessment & Plan Note (Signed)
 Will check for metabolic causes. Discussed options to include an SSRI as first line treatment at this time. She would need to try this prior to getting Veozah.

## 2023-11-10 NOTE — Assessment & Plan Note (Addendum)
 Will check HgbA1c. No current medications.

## 2023-11-10 NOTE — Assessment & Plan Note (Signed)

## 2023-11-10 NOTE — Assessment & Plan Note (Signed)
 Blood pressure is fairly controlled, continue current medications. Will check eGFR

## 2023-11-10 NOTE — Assessment & Plan Note (Signed)

## 2023-11-10 NOTE — Assessment & Plan Note (Signed)
 She is encouraged to strive for BMI less than 30 to decrease cardiac risk. Advised to aim for at least 150 minutes of exercise per week.

## 2023-11-17 ENCOUNTER — Ambulatory Visit
Admission: RE | Admit: 2023-11-17 | Discharge: 2023-11-17 | Disposition: A | Discharge status: Home / Self Care | Payer: Commercial Managed Care - PPO | Source: Ambulatory Visit | Attending: Family Medicine | Admitting: Family Medicine

## 2023-11-17 DIAGNOSIS — Z1231 Encounter for screening mammogram for malignant neoplasm of breast: Secondary | ICD-10-CM

## 2023-11-20 ENCOUNTER — Encounter: Payer: Self-pay | Admitting: Nurse Practitioner

## 2023-12-12 ENCOUNTER — Other Ambulatory Visit

## 2023-12-12 DIAGNOSIS — I10 Essential (primary) hypertension: Secondary | ICD-10-CM

## 2023-12-12 MED ORDER — AMLODIPINE BESYLATE 2.5 MG PO TABS: 2.5000 mg | ORAL_TABLET | Freq: Every day | ORAL | 1 refills | Status: DC

## 2023-12-12 NOTE — Progress Notes (Unsigned)
Labs in future

## 2023-12-13 LAB — LIPID PANEL
Chol/HDL Ratio: 3.6 ratio (ref 0.0–4.4)
Cholesterol, Total: 160 mg/dL (ref 100–199)
HDL: 44 mg/dL (ref >39)
LDL Chol Calc (NIH): 97 mg/dL (ref 0–99)
Triglycerides: 106 mg/dL (ref 0–149)
VLDL Cholesterol Cal: 19 mg/dL (ref 5–40)

## 2023-12-15 ENCOUNTER — Encounter: Payer: Self-pay | Admitting: Nurse Practitioner

## 2023-12-15 ENCOUNTER — Other Ambulatory Visit: Payer: Self-pay | Admitting: Nurse Practitioner

## 2023-12-15 MED ORDER — ATORVASTATIN CALCIUM 40 MG PO TABS: 40.0000 mg | ORAL_TABLET | Freq: Every day | ORAL | 1 refills | Status: DC

## 2024-03-09 ENCOUNTER — Ambulatory Visit: Payer: Self-pay | Admitting: Nurse Practitioner

## 2024-03-09 ENCOUNTER — Ambulatory Visit (INDEPENDENT_AMBULATORY_CARE_PROVIDER_SITE_OTHER): Admitting: Nurse Practitioner

## 2024-03-09 ENCOUNTER — Encounter: Payer: Self-pay | Admitting: Nurse Practitioner

## 2024-03-09 VITALS — BP 140/88 | HR 71 | Temp 98.5°F | Ht 67.0 in | Wt 230.0 lb

## 2024-03-09 DIAGNOSIS — R7303 Prediabetes: Secondary | ICD-10-CM | POA: Diagnosis not present

## 2024-03-09 DIAGNOSIS — I10 Essential (primary) hypertension: Secondary | ICD-10-CM

## 2024-03-09 DIAGNOSIS — Z79899 Other long term (current) drug therapy: Secondary | ICD-10-CM

## 2024-03-09 DIAGNOSIS — E66812 Obesity, class 2: Secondary | ICD-10-CM | POA: Diagnosis not present

## 2024-03-09 DIAGNOSIS — Z0000 Encounter for general adult medical examination without abnormal findings: Secondary | ICD-10-CM | POA: Diagnosis not present

## 2024-03-09 DIAGNOSIS — Z6836 Body mass index (BMI) 36.0-36.9, adult: Secondary | ICD-10-CM

## 2024-03-09 DIAGNOSIS — Z139 Encounter for screening, unspecified: Secondary | ICD-10-CM

## 2024-03-09 DIAGNOSIS — E6609 Other obesity due to excess calories: Secondary | ICD-10-CM

## 2024-03-09 LAB — POCT URINALYSIS DIP (CLINITEK)
Bilirubin, UA: NEGATIVE
Blood, UA: NEGATIVE
Glucose, UA: NEGATIVE mg/dL
Ketones, POC UA: NEGATIVE mg/dL
Leukocytes, UA: NEGATIVE
Nitrite, UA: NEGATIVE
POC PROTEIN,UA: 100 — AB
Spec Grav, UA: 1.015 (ref 1.010–1.025)
Urobilinogen, UA: 0.2 U/dL
pH, UA: 5.5 (ref 5.0–8.0)

## 2024-03-09 NOTE — Progress Notes (Signed)
 Dawn Lara, CMA,acting as a Neurosurgeon for Dawn Ada, FNP.,have documented all relevant documentation on the behalf of Dawn Ada, FNP,as directed by  Dawn Ada, FNP while in the presence of Dawn Ada, FNP.  Subjective:    Patient ID: Dawn Lara , female    DOB: 1965-11-11 , 58 y.o.   MRN: [id number]  Chief Complaint  Patient presents with   Annual Exam    Patient presents today for HM, Patient reports compliance with medication. Patient denies any chest pain, SOB, or headaches. Patient has no concerns today.     HPI  Here for HM. She has not seen any other providers since her last visit. She feels like the frequency of the hot flashes is not as often.      Past Medical History:  Diagnosis Date   Hypertension 2000   Well controlled on medication   Mnire's disease in remission 2005   Involving Right ear only--wore a hearing aide at one point, but not a problem now.   SLE (systemic lupus erythematosus) (HCC) 1986   Treated with prednisone and cyclosporine.  In remission by 1990.  Did have kidney/joint involvement, though states has had a short flare up in past 12 years.  Flares treated with prednisone for 7-10 days.  No longer followed by a Rheumatologist.   Stroke (HCC) 11/2016     Family History  Problem Relation Age of Onset   Hyperlipidemia Mother    Asthma Mother    Diabetes Father    Heart disease Father        Several MIs   Hypertension Father    Hypertension Brother    Breast cancer Paternal Grandmother      Current Outpatient Medications:    amLODipine  (NORVASC ) 2.5 MG tablet, Take 1 tablet (2.5 mg total) by mouth daily., Disp: 90 tablet, Rfl: 1   ASPIRIN LOW DOSE 81 MG EC tablet, Take 81 mg by mouth daily., Disp: , Rfl:    cetirizine (ZYRTEC) 10 MG tablet, Take 10 mg by mouth daily., Disp: , Rfl:    Cholecalciferol 50 MCG (2000 UT) CAPS, Take by mouth., Disp: , Rfl:    fluticasone (FLONASE) 50 MCG/ACT nasal spray, Place into both nostrils  daily., Disp: , Rfl:    lisinopril  (ZESTRIL ) 30 MG tablet, Take 1 tablet (30 mg total) by mouth daily., Disp: 90 tablet, Rfl: 1   MAGNESIUM GLUCONATE PO, Take by mouth., Disp: , Rfl:    meloxicam (MOBIC) 15 MG tablet, Take 1 tablet by mouth daily as needed., Disp: , Rfl:    Multiple Vitamins-Minerals (MULTIVITAMIN ADULT, MINERALS,) TABS, Take 1 tablet by mouth daily. With CoQ 10 and Omega 3, Disp: , Rfl:    Probiotic Product (PROBIOTIC BLEND PO), Take by mouth., Disp: , Rfl:    atorvastatin  (LIPITOR) 40 MG tablet, TAKE 1 TABLET BY MOUTH EVERY DAY, Disp: 90 tablet, Rfl: 1   Allergies  Allergen Reactions   Sulfa Antibiotics Itching      The patient states she uses status post hysterectomy for birth control. No LMP recorded. Patient has had a hysterectomy.. Negative for Dysmenorrhea and Negative for Menorrhagia. Negative for: breast discharge, breast lump(s), breast pain and breast self exam. Associated symptoms include abnormal vaginal bleeding. Pertinent negatives include abnormal bleeding (hematology), anxiety, decreased libido, depression, difficulty falling sleep, dyspareunia, history of infertility, nocturia, sexual dysfunction, sleep disturbances, urinary incontinence, urinary urgency, vaginal discharge and vaginal itching. Diet regular; she has changed her diet by getting rid of processed  foods. The patient states her exercise level is moderate every other day, she walks about 10,000 steps per week. She will do calistestics for about one hour at least 2 days a week.   The patient's tobacco use is:  Social History   Tobacco Use  Smoking Status Never  Smokeless Tobacco Never  . She has been exposed to passive smoke. The patient's alcohol use is:  Social History   Substance and Sexual Activity  Alcohol Use Yes   Alcohol/week: 1.0 standard drink of alcohol   Types: 1 Standard drinks or equivalent per week   Comment: occasional, 1-2 per month    Review of Systems  Constitutional:  Negative.  Negative for fatigue.  HENT: Negative.    Eyes: Negative.   Respiratory: Negative.    Cardiovascular: Negative.  Negative for chest pain and leg swelling.  Gastrointestinal: Negative.   Endocrine: Negative.   Genitourinary: Negative.   Musculoskeletal: Negative.   Skin: Negative.   Allergic/Immunologic: Negative.   Neurological: Negative.   Hematological: Negative.   Psychiatric/Behavioral: Negative.       Today's Vitals   03/09/24 1438 03/09/24 1546  BP: (!) 140/90 (!) 140/88  Pulse: 71   Temp: 98.5 F (36.9 C)   TempSrc: Oral   Weight: 230 lb (104.3 kg)   Height: 5' 7 (1.702 m)   PainSc: 0-No pain    Body mass index is 36.02 kg/m.  Wt Readings from Last 3 Encounters:  03/09/24 230 lb (104.3 kg)  10/23/23 247 lb (112 kg)  09/11/22 253 lb 14.4 oz (115.2 kg)     Objective:  Physical Exam Vitals and nursing note reviewed.  Constitutional:      General: She is not in acute distress.    Appearance: Normal appearance. She is well-developed. She is obese.  HENT:     Head: Normocephalic and atraumatic.     Right Ear: Hearing, tympanic membrane, ear canal and external ear normal. There is no impacted cerumen.     Left Ear: Hearing, tympanic membrane, ear canal and external ear normal. There is no impacted cerumen.     Nose: Nose normal.     Mouth/Throat:     Mouth: Mucous membranes are moist.  Eyes:     General: Lids are normal.     Extraocular Movements: Extraocular movements intact.     Conjunctiva/sclera: Conjunctivae normal.     Pupils: Pupils are equal, round, and reactive to light.     Funduscopic exam:    Right eye: No papilledema.        Left eye: No papilledema.  Neck:     Thyroid: No thyroid mass.     Vascular: No carotid bruit.  Cardiovascular:     Rate and Rhythm: Normal rate and regular rhythm.     Pulses: Normal pulses.     Heart sounds: Normal heart sounds. No murmur heard. Pulmonary:     Effort: Pulmonary effort is normal. No  respiratory distress.     Breath sounds: Normal breath sounds. No wheezing.  Chest:     Chest wall: No mass or tenderness.  Breasts:    Tanner Score is 5.     Right: Normal. No mass or tenderness.     Left: Normal. No mass or tenderness.  Abdominal:     General: Abdomen is flat. Bowel sounds are normal. There is no distension.     Palpations: Abdomen is soft.     Tenderness: There is no abdominal tenderness.  Genitourinary:  Rectum: Guaiac result negative.  Musculoskeletal:        General: No swelling. Normal range of motion.     Cervical back: Full passive range of motion without pain, normal range of motion and neck supple.     Right lower leg: No edema.     Left lower leg: No edema.  Lymphadenopathy:     Upper Body:     Right upper body: No supraclavicular, axillary or pectoral adenopathy.     Left upper body: No supraclavicular, axillary or pectoral adenopathy.  Skin:    General: Skin is warm and dry.     Capillary Refill: Capillary refill takes less than 2 seconds.  Neurological:     General: No focal deficit present.     Mental Status: She is alert and oriented to person, place, and time.     Cranial Nerves: No cranial nerve deficit.     Sensory: No sensory deficit.  Psychiatric:        Mood and Affect: Mood normal.        Behavior: Behavior normal.        Thought Content: Thought content normal.        Judgment: Judgment normal.         Assessment And Plan:     Encounter for annual health examination Assessment & Plan: Behavior modifications discussed and diet history reviewed.   Pt will continue to exercise regularly and modify diet with low GI, plant based foods and decrease intake of processed foods.  Recommend intake of daily multivitamin, Vitamin D, and calcium .  Recommend mammogram and colonoscopy for preventive screenings, as well as recommend immunizations that include influenza, TDAP, and Shingles    Primary hypertension Assessment & Plan: Blood  pressure is elevated this visit, continue current medications. Will check eGFR. EKG done with NSR HR 75  Orders: -     EKG 12-Lead -     POCT URINALYSIS DIP (CLINITEK) -     Microalbumin / creatinine urine ratio -     CMP14+EGFR  Prediabetes Assessment & Plan: Will check HgbA1c. No current medications.   Orders: -     Hemoglobin A1c -     Lipid panel  Class 2 obesity due to excess calories with body mass index (BMI) of 36.0 to 36.9 in adult, unspecified whether serious comorbidity present Assessment & Plan: She is encouraged to strive for BMI less than 30 to decrease cardiac risk. Advised to aim for at least 150 minutes of exercise per week.    Encounter for screening -     Hepatitis B surface antibody,qualitative  Other long term (current) drug therapy -     CBC with Differential/Platelet     Return for 1 year physical, 6 month bp check. Patient was given opportunity to ask questions. Patient verbalized understanding of the plan and was able to repeat key elements of the plan. All questions were answered to their satisfaction.   Dawn Ada, FNP  I, Dawn Ada, FNP, have reviewed all documentation for this visit. The documentation on 03/09/24 for the exam, diagnosis, procedures, and orders are all accurate and complete.

## 2024-03-10 LAB — CMP14+EGFR
ALT: 24 IU/L (ref 0–32)
AST: 23 IU/L (ref 0–40)
Albumin: 4.2 g/dL (ref 3.8–4.9)
Alkaline Phosphatase: 107 IU/L (ref 44–121)
BUN/Creatinine Ratio: 14 (ref 9–23)
BUN: 16 mg/dL (ref 6–24)
Bilirubin Total: 0.2 mg/dL (ref 0.0–1.2)
CO2: 21 mmol/L (ref 20–29)
Calcium: 9.7 mg/dL (ref 8.7–10.2)
Chloride: 102 mmol/L (ref 96–106)
Creatinine, Ser: 1.14 mg/dL — ABNORMAL HIGH (ref 0.57–1.00)
Globulin, Total: 2.8 g/dL (ref 1.5–4.5)
Glucose: 82 mg/dL (ref 70–99)
Potassium: 4.8 mmol/L (ref 3.5–5.2)
Sodium: 139 mmol/L (ref 134–144)
Total Protein: 7 g/dL (ref 6.0–8.5)
eGFR: 55 mL/min/1.73 — ABNORMAL LOW (ref >59)

## 2024-03-10 LAB — CBC WITH DIFFERENTIAL/PLATELET
Basophils Absolute: 0 x10E3/uL (ref 0.0–0.2)
Basos: 0 %
EOS (ABSOLUTE): 0.1 x10E3/uL (ref 0.0–0.4)
Eos: 2 %
Hematocrit: 41.5 % (ref 34.0–46.6)
Hemoglobin: 13.3 g/dL (ref 11.1–15.9)
Immature Grans (Abs): 0 x10E3/uL (ref 0.0–0.1)
Immature Granulocytes: 0 %
Lymphocytes Absolute: 2.2 x10E3/uL (ref 0.7–3.1)
Lymphs: 28 %
MCH: 28 pg (ref 26.6–33.0)
MCHC: 32 g/dL (ref 31.5–35.7)
MCV: 87 fL (ref 79–97)
Monocytes Absolute: 0.7 x10E3/uL (ref 0.1–0.9)
Monocytes: 9 %
Neutrophils Absolute: 4.9 x10E3/uL (ref 1.4–7.0)
Neutrophils: 61 %
Platelets: 263 x10E3/uL (ref 150–450)
RBC: 4.75 x10E6/uL (ref 3.77–5.28)
RDW: 14.6 % (ref 11.7–15.4)
WBC: 8 x10E3/uL (ref 3.4–10.8)

## 2024-03-10 LAB — HEMOGLOBIN A1C
Est. average glucose Bld gHb Est-mCnc: 117 mg/dL
Hgb A1c MFr Bld: 5.7 % — ABNORMAL HIGH (ref 4.8–5.6)

## 2024-03-10 LAB — MICROALBUMIN / CREATININE URINE RATIO
Creatinine, Urine: 66.4 mg/dL
Microalb/Creat Ratio: 759 mg/g{creat} — ABNORMAL HIGH (ref 0–29)
Microalbumin, Urine: 503.8 ug/mL

## 2024-03-10 LAB — LIPID PANEL
Chol/HDL Ratio: 4.1 ratio (ref 0.0–4.4)
Cholesterol, Total: 203 mg/dL — ABNORMAL HIGH (ref 100–199)
HDL: 49 mg/dL (ref >39)
LDL Chol Calc (NIH): 123 mg/dL — ABNORMAL HIGH (ref 0–99)
Triglycerides: 178 mg/dL — ABNORMAL HIGH (ref 0–149)
VLDL Cholesterol Cal: 31 mg/dL (ref 5–40)

## 2024-03-10 LAB — HEPATITIS B SURFACE ANTIBODY,QUALITATIVE: Hep B Surface Ab, Qual: NONREACTIVE

## 2024-03-17 ENCOUNTER — Other Ambulatory Visit: Payer: Self-pay | Admitting: Nurse Practitioner

## 2024-03-17 NOTE — Assessment & Plan Note (Addendum)
 Blood pressure is elevated this visit, continue current medications. Will check eGFR. EKG done with NSR HR 75

## 2024-03-17 NOTE — Assessment & Plan Note (Signed)

## 2024-03-17 NOTE — Assessment & Plan Note (Signed)
 Will check HgbA1c. No current medications.

## 2024-03-17 NOTE — Assessment & Plan Note (Signed)
 She is encouraged to strive for BMI less than 30 to decrease cardiac risk. Advised to aim for at least 150 minutes of exercise per week.

## 2024-04-09 ENCOUNTER — Other Ambulatory Visit: Payer: Self-pay | Admitting: Nurse Practitioner

## 2024-04-09 DIAGNOSIS — I10 Essential (primary) hypertension: Secondary | ICD-10-CM

## 2024-04-26 ENCOUNTER — Ambulatory Visit: Payer: Self-pay

## 2024-04-26 NOTE — Progress Notes (Signed)
 [name] Kristeen JINNY Gladis, CMA,acting as a Neurosurgeon for Dawn Ada, FNP.,have documented all relevant documentation on the behalf of Dawn Ada, FNP,as directed by  Dawn Ada, FNP while in the presence of Dawn Ada, FNP.  Subjective:  Patient ID: Dawn Lara , female    DOB: February 21, 1966 , 58 y.o.   MRN: [id number]  Chief Complaint  Patient presents with   Rectal Bleeding    Patient presents today for rectal bleeding, she reports it happed yesterday and hasn't happen since. She reports her stool was normal yesterday but noticed blood on tissue. Patient reports her stool on Sunday was a little bigger then normal but didn't  notice any blood.    Discussed the use of AI scribe software for clinical note transcription with the patient, who gave verbal consent to proceed.  History of Present Illness Dawn Lara is a 58 year old female with diverticulosis who presents with rectal bleeding.  She experienced rectal bleeding yesterday, noticing bright red blood on the tissue after using the bathroom, but did not observe any blood in the stool or toilet. She had a larger than usual stool on Saturday without straining during bowel movements. There was no bleeding on Sunday, but she noticed bright red blood again on Monday.  She experienced nausea on Sunday without vomiting and reports occasional stomach grumbling but no significant pain. No history of hemorrhoids and she is not currently on blood thinners, though she takes aspirin daily.  Her past medical history includes a diagnosis of diverticulosis from her last colonoscopy, and she has had polyps in the past. She recently started eating dragon fruit, which contains many seeds, potentially affecting her condition.   No previous history of hemorroids.     Past Medical History:  Diagnosis Date   Hypertension 2000   Well controlled on medication   Mnire's disease in remission 2005   Involving Right ear only--wore a hearing aide at one  point, but not a problem now.   SLE (systemic lupus erythematosus) (HCC) 1986   Treated with prednisone and cyclosporine.  In remission by 1990.  Did have kidney/joint involvement, though states has had a short flare up in past 12 years.  Flares treated with prednisone for 7-10 days.  No longer followed by a Rheumatologist.   Stroke (HCC) 11/2016     Family History  Problem Relation Age of Onset   Hyperlipidemia Mother    Asthma Mother    Diabetes Father    Heart disease Father        Several MIs   Hypertension Father    Hypertension Brother    Breast cancer Paternal Grandmother      Current Outpatient Medications:    amLODipine  (NORVASC ) 2.5 MG tablet, TAKE 1 TABLET BY MOUTH EVERY DAY, Disp: 90 tablet, Rfl: 1   ASPIRIN LOW DOSE 81 MG EC tablet, Take 81 mg by mouth daily., Disp: , Rfl:    atorvastatin  (LIPITOR) 40 MG tablet, TAKE 1 TABLET BY MOUTH EVERY DAY, Disp: 90 tablet, Rfl: 1   cetirizine (ZYRTEC) 10 MG tablet, Take 10 mg by mouth daily., Disp: , Rfl:    Cholecalciferol 50 MCG (2000 UT) CAPS, Take by mouth., Disp: , Rfl:    fluticasone (FLONASE) 50 MCG/ACT nasal spray, Place into both nostrils daily., Disp: , Rfl:    lisinopril  (ZESTRIL ) 30 MG tablet, TAKE 1 TABLET BY MOUTH EVERY DAY, Disp: 90 tablet, Rfl: 1   MAGNESIUM GLUCONATE PO, Take by mouth., Disp: , Rfl:  Multiple Vitamins-Minerals (MULTIVITAMIN ADULT, MINERALS,) TABS, Take 1 tablet by mouth daily. With CoQ 10 and Omega 3, Disp: , Rfl:    Probiotic Product (PROBIOTIC BLEND PO), Take by mouth., Disp: , Rfl:    Allergies  Allergen Reactions   Sulfa Antibiotics Itching     Review of Systems  Constitutional: Negative.  Negative for fatigue.  Respiratory: Negative.    Cardiovascular: Negative.  Negative for chest pain, palpitations and leg swelling.  Gastrointestinal:  Positive for blood in stool. Negative for abdominal distention, constipation, diarrhea, nausea, rectal pain and vomiting.  Musculoskeletal:  Negative.   Psychiatric/Behavioral: Negative.       Today's Vitals   04/27/24 1047  BP: 130/74  Pulse: 75  Temp: 98.7 F (37.1 C)  TempSrc: Oral  Weight: 239 lb 12.8 oz (108.8 kg)  Height: 5' 7 (1.702 m)  PainSc: 0-No pain   Body mass index is 37.56 kg/m.  Wt Readings from Last 3 Encounters:  04/27/24 239 lb 12.8 oz (108.8 kg)  03/09/24 230 lb (104.3 kg)  10/23/23 247 lb (112 kg)      Objective:  Physical Exam Vitals and nursing note reviewed.  Constitutional:      General: She is not in acute distress.    Appearance: Normal appearance. She is well-developed. She is obese.  Cardiovascular:     Rate and Rhythm: Normal rate and regular rhythm.     Pulses: Normal pulses.     Heart sounds: Normal heart sounds. No murmur heard. Pulmonary:     Effort: Pulmonary effort is normal. No respiratory distress.     Breath sounds: Normal breath sounds. No wheezing.  Chest:     Chest wall: No tenderness.  Musculoskeletal:        General: Normal range of motion.  Skin:    General: Skin is warm and dry.     Capillary Refill: Capillary refill takes less than 2 seconds.  Neurological:     General: No focal deficit present.     Mental Status: She is alert and oriented to person, place, and time.     Cranial Nerves: No cranial nerve deficit.  Psychiatric:        Mood and Affect: Mood normal.        Behavior: Behavior normal.        Thought Content: Thought content normal.        Judgment: Judgment normal.      Assessment And Plan:  Rectal bleeding Assessment & Plan: Intermittent bright red bleeding likely due to capillary rupture. No hemorrhoids found. Recent stool size may have caused stretching. Absence of blood in stool test is reassuring. - Check hemoglobin level for unseen bleeding. - Provide stool cards for home testing over three occasions. - Advise stool softener and increased water intake. - Instruct to monitor for stomach pain, nausea, or vomiting and contact  office if these occur for potential GI referral or CT scan.  Orders: -     CBC with Differential/Platelet  COVID-19 vaccination declined  Influenza vaccination declined  Class 2 obesity due to excess calories with body mass index (BMI) of 37.0 to 37.9 in adult, unspecified whether serious comorbidity present Assessment & Plan: She is encouraged to strive for BMI less than 30 to decrease cardiac risk. Advised to aim for at least 150 minutes of exercise per week.      Return for keep same next.  Patient was given opportunity to ask questions. Patient verbalized understanding of the plan and was  able to repeat key elements of the plan. All questions were answered to their satisfaction.    [name] Dawn Ada, FNP, have reviewed all documentation for this visit. The documentation on 04/27/24 for the exam, diagnosis, procedures, and orders are all accurate and complete.   IF YOU HAVE BEEN REFERRED TO A SPECIALIST, IT MAY TAKE 1-2 WEEKS TO SCHEDULE/PROCESS THE REFERRAL. IF YOU HAVE NOT HEARD FROM US /SPECIALIST IN TWO WEEKS, PLEASE GIVE US  A CALL AT [phone].

## 2024-04-26 NOTE — Telephone Encounter (Signed)
 FYI Only or Action Required?: FYI only for provider.  Patient was last seen in primary care on 03/09/2024 by Georgina Speaks, FNP.  Called Nurse Triage reporting Rectal Bleeding.  Symptoms began today.  Interventions attempted: OTC medications: AD ointment .  Symptoms are: stable.  Triage Disposition: See Physician Within 24 Hours  Patient/caregiver understands and will follow disposition?: Yes        Copied from CRM [id number]. Topic: Clinical - Red Word Triage >> Apr 26, 2024 10:05 AM Berwyn MATSU wrote: Red Word that prompted transfer to Nurse Triage: pian, discomfort, rectal bleeding. Reason for Disposition  MODERATE rectal bleeding (e.g., small blood clots, passing blood without stool, or toilet water turns red)  Answer Assessment - Initial Assessment Questions Patient states she saw some blood when wiping this morning after having a bowel movement. Pt states she felt nauseous yesterday but has since resolved. Using  AD ointment on rectal area for symptoms. Unsure if she has rectal soreness or irritation. Denies hx of hemorrhoids and any abdominal pain. Patient states she had a colonoscopy and was told she has inflamed polyps    1. APPEARANCE of BLOOD: What color is it? Is it passed separately, on the surface of the stool, or mixed in with the stool?      Was not enough to turn water red  2. AMOUNT: How much blood was passed?      A lot of specks of blood  3. FREQUENCY: How many times has blood been passed with the stools?      Once  4. ONSET: When was the blood first seen in the stools? (Days or weeks)      This morning  5. DIARRHEA: Is there also some diarrhea? If Yes, ask: How many diarrhea stools in the past 24 hours?      Denies  6. CONSTIPATION: Do you have constipation? If Yes, ask: How bad is it?     Denies  8. BLOOD THINNERS: Do you take any blood thinners? (e.g., aspirin, clopidogrel / Plavix, coumadin, heparin). Notes: Other strong blood thinners  include: Arixtra (fondaparinux), Eliquis (apixaban), Pradaxa (dabigatran), and Xarelto (rivaroxaban).     Aspirin  9. OTHER SYMPTOMS: Do you have any other symptoms?  (e.g., abdomen pain, vomiting, dizziness, fever)     Denies  Protocols used: Rectal Bleeding-A-AH

## 2024-04-27 ENCOUNTER — Ambulatory Visit (INDEPENDENT_AMBULATORY_CARE_PROVIDER_SITE_OTHER): Admitting: Nurse Practitioner

## 2024-04-27 ENCOUNTER — Encounter: Payer: Self-pay | Admitting: Nurse Practitioner

## 2024-04-27 VITALS — BP 130/74 | HR 75 | Temp 98.7°F | Ht 67.0 in | Wt 239.8 lb

## 2024-04-27 DIAGNOSIS — Z6837 Body mass index (BMI) 37.0-37.9, adult: Secondary | ICD-10-CM

## 2024-04-27 DIAGNOSIS — E66812 Obesity, class 2: Secondary | ICD-10-CM

## 2024-04-27 DIAGNOSIS — K625 Hemorrhage of anus and rectum: Secondary | ICD-10-CM

## 2024-04-27 DIAGNOSIS — E6609 Other obesity due to excess calories: Secondary | ICD-10-CM | POA: Diagnosis not present

## 2024-04-27 DIAGNOSIS — Z2821 Immunization not carried out because of patient refusal: Secondary | ICD-10-CM

## 2024-04-27 LAB — CBC WITH DIFFERENTIAL/PLATELET
Basophils Absolute: 0 x10E3/uL (ref 0.0–0.2)
Basos: 0 %
EOS (ABSOLUTE): 0.1 x10E3/uL (ref 0.0–0.4)
Eos: 1 %
Hematocrit: 43 % (ref 34.0–46.6)
Hemoglobin: 13.6 g/dL (ref 11.1–15.9)
Immature Grans (Abs): 0 x10E3/uL (ref 0.0–0.1)
Immature Granulocytes: 0 %
Lymphocytes Absolute: 2.2 x10E3/uL (ref 0.7–3.1)
Lymphs: 31 %
MCH: 27.4 pg (ref 26.6–33.0)
MCHC: 31.6 g/dL (ref 31.5–35.7)
MCV: 87 fL (ref 79–97)
Monocytes Absolute: 0.7 x10E3/uL (ref 0.1–0.9)
Monocytes: 9 %
Neutrophils Absolute: 4.1 x10E3/uL (ref 1.4–7.0)
Neutrophils: 59 %
Platelets: 274 x10E3/uL (ref 150–450)
RBC: 4.97 x10E6/uL (ref 3.77–5.28)
RDW: 14.3 % (ref 11.7–15.4)
WBC: 7.1 x10E3/uL (ref 3.4–10.8)

## 2024-05-03 ENCOUNTER — Ambulatory Visit: Payer: Self-pay | Admitting: Nurse Practitioner

## 2024-05-03 ENCOUNTER — Telehealth: Payer: Self-pay

## 2024-05-03 ENCOUNTER — Other Ambulatory Visit: Payer: Self-pay | Admitting: Nurse Practitioner

## 2024-05-03 ENCOUNTER — Other Ambulatory Visit (INDEPENDENT_AMBULATORY_CARE_PROVIDER_SITE_OTHER): Admitting: Nurse Practitioner

## 2024-05-03 DIAGNOSIS — K625 Hemorrhage of anus and rectum: Secondary | ICD-10-CM

## 2024-05-03 DIAGNOSIS — R195 Other fecal abnormalities: Secondary | ICD-10-CM

## 2024-05-03 LAB — HEMOCCULT GUIAC POC 1CARD (OFFICE)
Card #2 Fecal Occult Blod, POC: POSITIVE
Fecal Occult Blood, POC: POSITIVE — AB

## 2024-05-03 NOTE — Telephone Encounter (Signed)
 Copied from CRM [id number]. Topic: General - Other >> May 03, 2024 11:24 AM Tinnie C wrote: Reason for CRM: Pt returning call regarding fecal sample she just did. Pt says that she has a gastroenterologist [facility]), but will accept a referral to a different one. Unhappy with care there.

## 2024-05-03 NOTE — Telephone Encounter (Signed)
 I have placed a referral to [facility].

## 2024-05-05 DIAGNOSIS — K625 Hemorrhage of anus and rectum: Secondary | ICD-10-CM | POA: Insufficient documentation

## 2024-05-05 DIAGNOSIS — E6609 Other obesity due to excess calories: Secondary | ICD-10-CM | POA: Insufficient documentation

## 2024-05-05 NOTE — Assessment & Plan Note (Signed)
 Intermittent bright red bleeding likely due to capillary rupture. No hemorrhoids found. Recent stool size may have caused stretching. Absence of blood in stool test is reassuring. - Check hemoglobin level for unseen bleeding. - Provide stool cards for home testing over three occasions. - Advise stool softener and increased water intake. - Instruct to monitor for stomach pain, nausea, or vomiting and contact office if these occur for potential GI referral or CT scan.

## 2024-05-05 NOTE — Assessment & Plan Note (Signed)
 She is encouraged to strive for BMI less than 30 to decrease cardiac risk. Advised to aim for at least 150 minutes of exercise per week.

## 2024-05-14 ENCOUNTER — Ambulatory Visit
Admission: EM | Admit: 2024-05-14 | Discharge: 2024-05-14 | Disposition: A | Discharge status: Home / Self Care | Attending: Emergency Medicine | Admitting: Emergency Medicine

## 2024-05-14 ENCOUNTER — Encounter: Payer: Self-pay | Admitting: Emergency Medicine

## 2024-05-14 DIAGNOSIS — B349 Viral infection, unspecified: Secondary | ICD-10-CM | POA: Diagnosis not present

## 2024-05-14 DIAGNOSIS — U071 COVID-19: Secondary | ICD-10-CM

## 2024-05-14 LAB — POC SOFIA SARS ANTIGEN FIA: SARS Coronavirus 2 Ag: POSITIVE — AB

## 2024-05-14 MED ORDER — PAXLOVID (300/100) 20 X 150 MG & 10 X 100MG PO TBPK: 3.0000 | ORAL_TABLET | Freq: Two times a day (BID) | ORAL | 0 refills | Status: AC

## 2024-05-14 NOTE — ED Provider Notes (Signed)
 [facility]    CSN: [id number] Arrival date & time: 05/14/24  1416      History   Chief Complaint Chief Complaint  Patient presents with   Nasal Congestion   Headache    HPI NNEKA BLANDA is a 58 y.o. female.   Patient presents for evaluation of subjective fever, chills, nasal congestion, intermittent headaches, intermittently productive cough present for 2 days.  Began experiencing nausea without vomiting and watery diarrhea 1 day ago.  No known sick contacts but did attend large gathering recently.  Has attempted use of Mucinex which has been helpful.  Tolerating food and liquids but appetite is decreased.  Past Medical History:  Diagnosis Date   Hypertension 2000   Well controlled on medication   Mnire's disease in remission 2005   Involving Right ear only--wore a hearing aide at one point, but not a problem now.   SLE (systemic lupus erythematosus) (HCC) 1986   Treated with prednisone and cyclosporine.  In remission by 1990.  Did have kidney/joint involvement, though states has had a short flare up in past 12 years.  Flares treated with prednisone for 7-10 days.  No longer followed by a Rheumatologist.   Stroke (HCC) 11/2016    Patient Active Problem List   Diagnosis Date Noted   Rectal bleeding 05/05/2024   Class 2 obesity due to excess calories with body mass index (BMI) of 37.0 to 37.9 in adult 05/05/2024   Encounter for annual health examination 03/09/2024   Class 2 obesity due to excess calories with body mass index (BMI) of 36.0 to 36.9 in adult 03/09/2024   Establishing care with new doctor, encounter for 11/10/2023   Night sweats 11/10/2023   Hot flashes due to menopause 11/10/2023   Prediabetes 11/10/2023   Influenza vaccination declined 11/10/2023   COVID-19 vaccination declined 11/10/2023   History of CVA (cerebrovascular accident) 11/10/2023   Class 2 obesity due to excess calories with body mass index (BMI) of 38.0 to 38.9 in adult  11/10/2023   BP (high blood pressure) 03/22/2014    Past Surgical History:  Procedure Laterality Date   ABDOMINAL HYSTERECTOMY  2011   TAH:  done for recurrent fibroids   BREAST BIOPSY Left 2010   Scar tissue from breast reduction surgery   BREAST REDUCTION SURGERY Bilateral 2009   COLONOSCOPY  2008?   myomecotomy     x 2 in past prior to hysterectomy    OB History     Gravida  3   Para      Term      Preterm      AB  3   Living  0      SAB      IAB  2   Ectopic  1   Multiple      Live Births  0            Home Medications    Prior to Admission medications   Medication Sig Start Date End Date Taking? Authorizing Provider  nirmatrelvir/ritonavir (PAXLOVID , 300/100,) 20 x 150 MG & 10 x 100MG  TBPK Take 3 tablets by mouth 2 (two) times daily for 5 days. Patient GFR is 65 Take nirmatrelvir (150 mg) two tablets twice daily for 5 days and ritonavir (100 mg) one tablet twice daily for 5 days. 05/14/24 05/19/24 Yes Santos Sollenberger R, NP  amLODipine  (NORVASC ) 2.5 MG tablet TAKE 1 TABLET BY MOUTH EVERY DAY 04/09/24   Georgina Speaks, FNP  ASPIRIN LOW  DOSE 81 MG EC tablet Take 81 mg by mouth daily. 07/10/20   [provider]  atorvastatin  (LIPITOR) 40 MG tablet TAKE 1 TABLET BY MOUTH EVERY DAY 03/17/24   Moore, Janece, FNP  cetirizine (ZYRTEC) 10 MG tablet Take 10 mg by mouth daily.    [provider]  Cholecalciferol 50 MCG (2000 UT) CAPS Take by mouth.    [provider]  fluticasone (FLONASE) 50 MCG/ACT nasal spray Place into both nostrils daily.    [provider]  lisinopril  (ZESTRIL ) 30 MG tablet TAKE 1 TABLET BY MOUTH EVERY DAY 04/09/24   Georgina Speaks, FNP  MAGNESIUM GLUCONATE PO Take by mouth.    [provider]  Multiple Vitamins-Minerals (MULTIVITAMIN ADULT, MINERALS,) TABS Take 1 tablet by mouth daily. With CoQ 10 and Omega 3    [provider]  Probiotic Product (PROBIOTIC BLEND PO) Take by mouth.     [provider]    Family History Family History  Problem Relation Age of Onset   Hyperlipidemia Mother    Asthma Mother    Diabetes Father    Heart disease Father        Several MIs   Hypertension Father    Hypertension Brother    Breast cancer Paternal Grandmother     Social History Social History   Tobacco Use   Smoking status: Never   Smokeless tobacco: Never  Vaping Use   Vaping status: Never Used  Substance Use Topics   Alcohol use: Yes    Alcohol/week: 1.0 standard drink of alcohol    Types: 1 Standard drinks or equivalent per week    Comment: occasional, 1-2 per month   Drug use: No     Allergies   Sulfa antibiotics   Review of Systems Review of Systems  Neurological:  Positive for headaches.     Physical Exam Triage Vital Signs ED Triage Vitals  Encounter Vitals Group     BP 05/14/24 1430 120/62     Girls Systolic BP Percentile --      Girls Diastolic BP Percentile --      Boys Systolic BP Percentile --      Boys Diastolic BP Percentile --      Pulse Rate 05/14/24 1430 76     Resp --      Temp 05/14/24 1430 98.7 F (37.1 C)     Temp Source 05/14/24 1430 Oral     SpO2 05/14/24 1430 98 %     Weight --      Height --      Head Circumference --      Peak Flow --      Pain Score 05/14/24 1425 7     Pain Loc --      Pain Education --      Exclude from Growth Chart --    No data found.  Updated Vital Signs BP 120/62 (BP Location: Left Arm)   Pulse 76   Temp 98.7 F (37.1 C) (Oral)   SpO2 98%   Visual Acuity Right Eye Distance:   Left Eye Distance:   Bilateral Distance:    Right Eye Near:   Left Eye Near:    Bilateral Near:     Physical Exam Constitutional:      Appearance: Normal appearance.  HENT:     Head: Normocephalic.     Right Ear: Tympanic membrane, ear canal and external ear normal.     Left Ear: Tympanic membrane, ear canal and  external ear normal.     Nose: Congestion present.     Mouth/Throat:      Mouth: Mucous membranes are moist.     Pharynx: Oropharynx is clear. No oropharyngeal exudate or posterior oropharyngeal erythema.  Eyes:     Extraocular Movements: Extraocular movements intact.  Cardiovascular:     Rate and Rhythm: Normal rate and regular rhythm.     Pulses: Normal pulses.     Heart sounds: Normal heart sounds.  Pulmonary:     Effort: Pulmonary effort is normal.     Breath sounds: Normal breath sounds.  Neurological:     Mental Status: She is alert and oriented to person, place, and time. Mental status is at baseline.      UC Treatments / Results  Labs (all labs ordered are listed, but only abnormal results are displayed) Labs Reviewed  POC SOFIA SARS ANTIGEN FIA - Abnormal; Notable for the following components:      Result Value   SARS Coronavirus 2 Ag Positive (*)    All other components within normal limits    EKG   Radiology No results found.  Procedures Procedures (including critical care time)  Medications Ordered in UC Medications - No data to display  Initial Impression / Assessment and Plan / UC Course  I have reviewed the triage vital signs and the nursing notes.  Pertinent labs & imaging results that were available during my care of the patient were reviewed by me and considered in my medical decision making (see chart for details).  COVID-19, viral illness  Patient is in no signs of distress nor toxic appearing.  Vital signs are stable.  Low suspicion for pneumonia, pneumothorax or bronchitis and therefore will defer imaging.  Discussed quarantine per the CDC.  Prescribed Paxlovid .May use additional over-the-counter medications as needed for supportive care.  May follow-up with urgent care as needed if symptoms persist or worsen.   Final Clinical Impressions(s) / UC Diagnoses   Final diagnoses:  Viral illness  COVID-19     Discharge Instructions      Covid 19 is a virus and should steadily improve in time it can take up to 7 to  10 days before you truly start to see a turnaround however things will get better    Per the CDC you will need to quarantine and to your 24 hours without fever, if no fever may continue activity wearing mask  Take Paxlovid  twice daily for 5 days to help reduce the amount of virus in your body which helps to calm symptoms in, that you are sick, does not fully take away your illness  You can take Tylenol  and/or Ibuprofen as needed for fever reduction and pain relief.   For cough: honey 1/2 to 1 teaspoon (you can dilute the honey in water or another fluid).  You can also use guaifenesin and dextromethorphan for cough. You can use a humidifier for chest congestion and cough.  If you don't have a humidifier, you can sit in the bathroom with the hot shower running.      For sore throat: try warm salt water gargles, cepacol lozenges, throat spray, warm tea or water with lemon/honey, popsicles or ice, or OTC cold relief medicine for throat discomfort.   For congestion: take a daily anti-histamine like Zyrtec, Claritin, and a oral decongestant, such as pseudoephedrine.  You can also use Flonase 1-2 sprays in each nostril daily.   It is important to stay hydrated: drink plenty of fluids (  water, gatorade/powerade/pedialyte, juices, or teas) to keep your throat moisturized and help further relieve irritation/discomfort.    ED Prescriptions     Medication Sig Dispense Auth. Provider   nirmatrelvir/ritonavir (PAXLOVID , 300/100,) 20 x 150 MG & 10 x 100MG  TBPK Take 3 tablets by mouth 2 (two) times daily for 5 days. Patient GFR is 65 Take nirmatrelvir (150 mg) two tablets twice daily for 5 days and ritonavir (100 mg) one tablet twice daily for 5 days. 30 tablet Krishawna Stiefel R, NP      PDMP not reviewed this encounter.   Teresa Shelba SAUNDERS, [place] 05/14/24 [phone]

## 2024-05-14 NOTE — ED Triage Notes (Signed)
 Patient reports nasal congestion and head ache x 2 days. Patient also reports 1 episode of diarrhea on yesterday. Rates pain 7/10. Patient has taken mucinex DM that helped decrease symptoms.

## 2024-05-14 NOTE — Discharge Instructions (Signed)
 Covid 19 is a virus and should steadily improve in time it can take up to 7 to 10 days before you truly start to see a turnaround however things will get better    Per the CDC you will need to quarantine and to your 24 hours without fever, if no fever may continue activity wearing mask  Take Paxlovid  twice daily for 5 days to help reduce the amount of virus in your body which helps to calm symptoms in, that you are sick, does not fully take away your illness  You can take Tylenol  and/or Ibuprofen as needed for fever reduction and pain relief.   For cough: honey 1/2 to 1 teaspoon (you can dilute the honey in water or another fluid).  You can also use guaifenesin and dextromethorphan for cough. You can use a humidifier for chest congestion and cough.  If you don't have a humidifier, you can sit in the bathroom with the hot shower running.      For sore throat: try warm salt water gargles, cepacol lozenges, throat spray, warm tea or water with lemon/honey, popsicles or ice, or OTC cold relief medicine for throat discomfort.   For congestion: take a daily anti-histamine like Zyrtec, Claritin, and a oral decongestant, such as pseudoephedrine.  You can also use Flonase 1-2 sprays in each nostril daily.   It is important to stay hydrated: drink plenty of fluids (water, gatorade/powerade/pedialyte, juices, or teas) to keep your throat moisturized and help further relieve irritation/discomfort.

## 2024-05-25 ENCOUNTER — Encounter: Payer: Self-pay | Admitting: Gastroenterology

## 2024-06-25 ENCOUNTER — Emergency Department

## 2024-06-25 ENCOUNTER — Emergency Department
Admission: EM | Admit: 2024-06-25 | Discharge: 2024-06-25 | Disposition: A | Discharge status: Home / Self Care | Attending: Emergency Medicine | Admitting: Emergency Medicine

## 2024-06-25 ENCOUNTER — Other Ambulatory Visit: Payer: Self-pay

## 2024-06-25 DIAGNOSIS — Z8673 Personal history of transient ischemic attack (TIA), and cerebral infarction without residual deficits: Secondary | ICD-10-CM | POA: Diagnosis not present

## 2024-06-25 DIAGNOSIS — I10 Essential (primary) hypertension: Secondary | ICD-10-CM | POA: Insufficient documentation

## 2024-06-25 DIAGNOSIS — M5136 Other intervertebral disc degeneration, lumbar region with discogenic back pain only: Secondary | ICD-10-CM | POA: Insufficient documentation

## 2024-06-25 DIAGNOSIS — M545 Low back pain, unspecified: Secondary | ICD-10-CM | POA: Diagnosis present

## 2024-06-25 MED ORDER — LIDOCAINE 5 % EX PTCH
1.0000 | MEDICATED_PATCH | CUTANEOUS | Status: DC
  Administered 2024-06-25: 1 via TRANSDERMAL
  Filled 2024-06-25: qty 1

## 2024-06-25 MED ORDER — IBUPROFEN 600 MG PO TABS: 600.0000 mg | ORAL_TABLET | Freq: Four times a day (QID) | ORAL | 0 refills | Status: AC | PRN

## 2024-06-25 MED ORDER — IBUPROFEN 600 MG PO TABS
600.0000 mg | ORAL_TABLET | Freq: Once | ORAL | Status: AC
  Administered 2024-06-25: 600 mg via ORAL
  Filled 2024-06-25: qty 1

## 2024-06-25 NOTE — ED Notes (Signed)
 Pt given DC instructions. Pt verbalized understanding of medications and follow up care. Pt ambulatory from ED without difficulty.

## 2024-06-25 NOTE — ED Provider Notes (Signed)
 [facility] Provider Note    Event Date/Time   First MD Initiated Contact with Patient 06/25/24 0915     (approximate)   History   Back Pain   HPI  Dawn Lara is a 58 y.o. female   presents to the ED from home with complaint of low back pain for the last few days.  Patient states that she had a friend darted walking 3 days a week and has been keeping this up for approximately 100 days.  She states that she generally walks approximately 2 to 2-1/2 miles per day.  She denies any injury to her back.  No urinary symptoms, history of kidney stones, incontinence of bowel or bladder or saddle anesthesias.  Patient has history of hypertension, prediabetes, history of CVA, systemic lupus.      Physical Exam   Triage Vital Signs: ED Triage Vitals  Encounter Vitals Group     BP 06/25/24 0903 (!) 165/97     Girls Systolic BP Percentile --      Girls Diastolic BP Percentile --      Boys Systolic BP Percentile --      Boys Diastolic BP Percentile --      Pulse Rate 06/25/24 0903 89     Resp 06/25/24 0903 18     Temp 06/25/24 0903 98 F (36.7 C)     Temp Source 06/25/24 0903 Oral     SpO2 06/25/24 0903 98 %     Weight --      Height --      Head Circumference --      Peak Flow --      Pain Score 06/25/24 0901 5     Pain Loc --      Pain Education --      Exclude from Growth Chart --     Most recent vital signs: Vitals:   06/25/24 0903 06/25/24 1041  BP: (!) 165/97 (!) 186/76  Pulse: 89 70  Resp: 18 18  Temp: 98 F (36.7 C) 98 F (36.7 C)  SpO2: 98% 99%     General: Awake, no distress.  Alert, cooperative, talkative. CV:  Good peripheral perfusion.  Heart regular rate rhythm. Resp:  Normal effort.  Lungs clear bilaterally. Abd:  No distention.  Soft, nontender. Other:  There is diffuse tenderness on palpation of the lumbar spine both proximal and distal and paravertebral muscle.  Patient is able move upper and lower extremities without  difficulty and can ambulate with minimal assistance getting from supine to sitting.  Good muscle strength.   ED Results / Procedures / Treatments   Labs (all labs ordered are listed, but only abnormal results are displayed) Labs Reviewed - No data to display    RADIOLOGY  Lumbar spine x-ray images were reviewed by myself independent of the radiologist and also radiology report shows minimal to moderate degenerative disc disease in multiple levels.  No compression fracture noted.   PROCEDURES:  Critical Care performed:   Procedures   MEDICATIONS ORDERED IN ED: Medications  lidocaine (LIDODERM) 5 % 1 patch (1 patch Transdermal Patch Applied 06/25/24 1038)  ibuprofen (ADVIL) tablet 600 mg (600 mg Oral Given 06/25/24 1038)     IMPRESSION / MDM / ASSESSMENT AND PLAN / ED COURSE  I reviewed the triage vital signs and the nursing notes.   Differential diagnosis includes, but is not limited to, lumbar pain, strain, compression fracture, degenerative disc disease, musculoskeletal pain.  58 year old female presents  to the ED with complaint of lumbar pain that started several days ago.  Patient has been in a walking program that she and her friend are doing and has been walking approximately 2 to 2-1/2 miles every other day for the last 100 days.  Patient has taken Tylenol  without any improvement.  She denies any injury.  X-rays were obtained and patient does have mild to moderate degenerative disc disease at multiple levels.  We discussed anti-inflammatory along with Lidoderm patch.  We also discussed the type of shoes that she is walking in and to make sure that they have plenty of support.  She has plans today to go to [facility] have her arch and gait evaluated for a new pair of shoes.  She is to follow-up with her PCP if any continued problems or concerns.  A prescription for ibuprofen 600 mg every 6 hours with food was sent to the pharmacy and patient is encouraged to pick up the  over-the-counter Lidoderm patches.      Patient's presentation is most consistent with acute complicated illness / injury requiring diagnostic workup.  FINAL CLINICAL IMPRESSION(S) / ED DIAGNOSES   Final diagnoses:  Degeneration of intervertebral disc of lumbar region with discogenic back pain     Rx / DC Orders   ED Discharge Orders          Ordered    ibuprofen (ADVIL) 600 MG tablet  Every 6 hours PRN        06/25/24 1035             Note:  This document was prepared using Dragon voice recognition software and may include unintentional dictation errors.   Saunders Shona CROME, PA-C 06/25/24 1321    Claudene Rover, MD 06/25/24 [phone]

## 2024-06-25 NOTE — ED Triage Notes (Signed)
 Pt to ED via POV from home. Pt reports has been walking for the past 100 days and started to have lower back pain within the last few days. Pt took regular meds this morning and tylenol  last pm.

## 2024-06-25 NOTE — Discharge Instructions (Signed)
 Follow-up with your primary care provider if any continued problems or concerns.  A prescription for ibuprofen 600 mg was sent to the pharmacy that you can take every 6 hours if needed.  You may also apply lidocaine patches to your back, ice or heat as needed for comfort measures.  You may continue walking but make sure that you have good supportive shoes which will help with back pain.  Starting out once your back pain has improved start with shorter walks and work your way back up to your 2 miles.

## 2024-06-30 ENCOUNTER — Telehealth: Payer: Self-pay

## 2024-06-30 NOTE — Transitions of Care (Post Inpatient/ED Visit) (Signed)
   06/30/2024  Name: Dawn Lara MRN: [id number] DOB: 04-22-1966  Today's TOC FU Call Status: Today's TOC FU Call Status:: Successful TOC FU Call Completed TOC FU Call Complete Date: 06/30/24  Patient's Name and Date of Birth confirmed. DOB, Name  Transition Care Management Follow-up Telephone Call Date of Discharge: 06/25/24 Discharge Facility: [facility]) Type of Discharge: Inpatient Admission Primary Inpatient Discharge Diagnosis:: Degeneration of intervertebral disc of lumbar region with discogenic back pain How have you been since you were released from the hospital?: Better Any questions or concerns?: No  Items Reviewed: Did you receive and understand the discharge instructions provided?: Yes Medications obtained,verified, and reconciled?: Yes (Medications Reviewed) Any new allergies since your discharge?: No Dietary orders reviewed?: No Do you have support at home?: Yes  Medications Reviewed Today: Medications Reviewed Today   Medications were not reviewed in this encounter     Home Care and Equipment/Supplies: Were Home Health Services Ordered?: No Any new equipment or medical supplies ordered?: No  Functional Questionnaire: Do you need assistance with bathing/showering or dressing?: No Do you need assistance with meal preparation?: No Do you need assistance with eating?: No Do you have difficulty maintaining continence: No Do you need assistance with getting out of bed/getting out of a chair/moving?: No Do you have difficulty managing or taking your medications?: No  Follow up appointments reviewed: PCP Follow-up appointment confirmed?: No (patient declined.) MD Provider Line [phone] Given: Yes Specialist Hospital Follow-up appointment confirmed?: No Do you need transportation to your follow-up appointment?: No Do you understand care options if your condition(s) worsen?: Yes-patient verbalized  understanding    SIGNATURE Kristeen Lunger, CMA

## 2024-07-13 NOTE — Progress Notes (Unsigned)
 Chief Complaint: Rectal bleeding Primary GI MD: Unassigned  HPI: 58 year old female history of stroke (2018), hypertension, SLE, history of colon polyps, presents for evaluation of rectal bleeding  History of Present Illness  Dawn Lara is a 58 year old female with a history of polyps and diverticulitis who presents with rectal bleeding.  She experiences rectal bleeding characterized by bright red blood on tissue paper, occurring a couple of times a week. There is no blood observed in the toilet, but stool tests have confirmed the presence of blood. The bleeding is not associated with pain and is described as 'not a lot' but frequent enough to be concerning.  She has a history of polyps, with her first colonoscopy revealing seven polyps, and subsequent colonoscopies showing a reduction in number. Her most recent colonoscopy last year showed two polyps, one of which was precancerous, and multiple polyps in the rectum that were not removed. She has been undergoing colonoscopies every five years since the age of 49 due to a family history of colon cancer, specifically her grandmother.  She has daily bowel movements without straining and experiences very rare abdominal pain. No nausea, vomiting, or rectal pain associated with the bleeding, although she occasionally experiences a shooting pain through the rectum.    PREVIOUS GI WORKUP   Colonoscopy in 2020: Reportedly had many polyps with a 3-year repeat previously followed by Dr. Leotis -we do not have these records  Colonoscopy 01/2023 with Dr. Onita showing two polyps (1 TA), diverticulosis, multiple diminutive polyps in rectum (resection not attempted).  Past Medical History:  Diagnosis Date   Arthritis    Diverticulosis    Elevated cholesterol    History of colon polyps    Hypertension 2000   Well controlled on medication   Mnire's disease in remission 2005   Involving Right ear only--wore a hearing aide at one point, but  not a problem now.   Obesity    SLE (systemic lupus erythematosus) (HCC) 1986   Treated with prednisone and cyclosporine.  In remission by 1990.  Did have kidney/joint involvement, though states has had a short flare up in past 12 years.  Flares treated with prednisone for 7-10 days.  No longer followed by a Rheumatologist.   Stroke (HCC) 11/2016    Past Surgical History:  Procedure Laterality Date   ABDOMINAL HYSTERECTOMY  2011   TAH:  done for recurrent fibroids   BREAST BIOPSY Left 2010   Scar tissue from breast reduction surgery   BREAST REDUCTION SURGERY Bilateral 2009   COLONOSCOPY  2008?   myomecotomy     x 2 in past prior to hysterectomy    Current Outpatient Medications  Medication Sig Dispense Refill   amLODipine  (NORVASC ) 2.5 MG tablet TAKE 1 TABLET BY MOUTH EVERY DAY 90 tablet 1   ASPIRIN LOW DOSE 81 MG EC tablet Take 81 mg by mouth daily.     atorvastatin  (LIPITOR) 40 MG tablet TAKE 1 TABLET BY MOUTH EVERY DAY 90 tablet 1   cetirizine (ZYRTEC) 10 MG tablet Take 10 mg by mouth daily.     Cholecalciferol 50 MCG (2000 UT) CAPS Take by mouth.     fluticasone (FLONASE) 50 MCG/ACT nasal spray Place into both nostrils daily.     hydrocortisone  (ANUSOL -HC) 2.5 % rectal cream Place 1 Application rectally 2 (two) times daily. For 14 days 30 g 1   hydrocortisone  (ANUSOL -HC) 25 MG suppository Place 1 suppository (25 mg total) rectally 2 (two) times daily. For  14 days 28 suppository 0   ibuprofen  (ADVIL ) 600 MG tablet Take 1 tablet (600 mg total) by mouth every 6 (six) hours as needed. With food 30 tablet 0   lisinopril  (ZESTRIL ) 30 MG tablet TAKE 1 TABLET BY MOUTH EVERY DAY 90 tablet 1   MAGNESIUM GLUCONATE PO Take by mouth.     Multiple Vitamins-Minerals (MULTIVITAMIN ADULT, MINERALS,) TABS Take 1 tablet by mouth daily. With CoQ 10 and Omega 3     Probiotic Product (PROBIOTIC BLEND PO) Take by mouth.     No current facility-administered medications for this visit.    Allergies  as of 07/14/2024 - Review Complete 07/14/2024  Allergen Reaction Noted   Sulfa antibiotics Itching 02/06/2012    Family History  Problem Relation Age of Onset   Hyperlipidemia Mother    Asthma Mother    Diabetes Father    Heart disease Father        Several MIs   Hypertension Father    Hypertension Brother    Breast cancer Paternal Grandmother     Social History   Socioeconomic History   Marital status: Divorced    Spouse name: Not on file   Number of children: 0   Years of education: Not on file   Highest education level: Not on file  Occupational History   Occupation: Advertising Account Planner  Tobacco Use   Smoking status: Never   Smokeless tobacco: Never  Vaping Use   Vaping status: Never Used  Substance and Sexual Activity   Alcohol use: Yes    Alcohol/week: 1.0 standard drink of alcohol    Types: 1 Standard drinks or equivalent per week    Comment: occasional, 1-2 per month   Drug use: No   Sexual activity: Yes    Partners: Male    Birth control/protection: Surgical  Other Topics Concern   Not on file  Social History Narrative   Originally from [place]    Has been living in [place] for 12 years.   College grad--[employer] in [place]    Social Drivers of Health   Financial Resource Strain: Patient Declined (08/16/2022)   Received from [facility]   Overall Financial Resource Strain (CARDIA)    Difficulty of Paying Living Expenses: Patient declined  Food Insecurity: Patient Declined (08/16/2022)   Received from [facility]   Hunger Vital Sign    Within the past 12 months, you worried that your food would run out before you got the money to buy more.: Patient declined    Within the past 12 months, the food you bought just didn't last and you didn't have money to get more.: Patient declined  Transportation Needs: Patient Declined (08/16/2022)   Received from [facility] - Transportation    In the  past 12 months, has lack of transportation kept you from medical appointments or from getting medications?: Patient declined    Lack of Transportation (Non-Medical): Patient declined  Physical Activity: Not on file  Stress: Not on file  Social Connections: Not on file  Intimate Partner Violence: Not on file    Review of Systems:    Constitutional: No weight loss, fever, chills, weakness or fatigue HEENT: Eyes: No change in vision               Ears, Nose, Throat:  No change in hearing or congestion Skin: No rash or itching Cardiovascular: No chest pain, chest pressure or palpitations   Respiratory: No SOB or  cough Gastrointestinal: See HPI and otherwise negative Genitourinary: No dysuria or change in urinary frequency Neurological: No headache, dizziness or syncope Musculoskeletal: No new muscle or joint pain Hematologic: No bleeding or bruising Psychiatric: No history of depression or anxiety    Physical Exam:  Vital signs: BP 124/80   Pulse 79   Ht 5' 7 (1.702 m)   Wt 240 lb (108.9 kg)   BMI 37.59 kg/m   Constitutional: NAD, alert and cooperative Head:  Normocephalic and atraumatic. Eyes:   PEERL, EOMI. No icterus. Conjunctiva pink. Respiratory: Respirations even and unlabored. Lungs clear to auscultation bilaterally.   No wheezes, crackles, or rhonchi.  Cardiovascular:  Regular rate and rhythm. No peripheral edema, cyanosis or pallor.  Gastrointestinal:  Soft, nondistended, nontender. No rebound or guarding. Normal bowel sounds. No appreciable masses or hepatomegaly. Rectal:  Declines Msk:  Symmetrical without gross deformities. Without edema, no deformity or joint abnormality.  Neurologic:  Alert and  oriented x4;  grossly normal neurologically.  Skin:   Dry and intact without significant lesions or rashes. Psychiatric: Oriented to person, place and time. Demonstrates good judgement and reason without abnormal affect or behaviors.  Physical Exam    RELEVANT LABS  AND IMAGING: CBC    Component Value Date/Time   WBC 7.1 04/27/2024 1126   WBC 13.5 (H) 06/23/2010 0619   RBC 4.97 04/27/2024 1126   RBC 4.21 06/23/2010 0619   HGB 13.6 04/27/2024 1126   HCT 43.0 04/27/2024 1126   PLT 274 04/27/2024 1126   MCV 87 04/27/2024 1126   MCH 27.4 04/27/2024 1126   MCH 30.0 06/23/2010 0619   MCHC 31.6 04/27/2024 1126   MCHC 34.1 06/23/2010 0619   RDW 14.3 04/27/2024 1126   LYMPHSABS 2.2 04/27/2024 1126   EOSABS 0.1 04/27/2024 1126   BASOSABS 0.0 04/27/2024 1126    CMP     Component Value Date/Time   NA 139 03/09/2024 1604   K 4.8 03/09/2024 1604   CL 102 03/09/2024 1604   CO2 21 03/09/2024 1604   GLUCOSE 82 03/09/2024 1604   GLUCOSE 103 (H) 06/23/2010 0538   BUN 16 03/09/2024 1604   CREATININE 1.14 (H) 03/09/2024 1604   CALCIUM  9.7 03/09/2024 1604   PROT 7.0 03/09/2024 1604   ALBUMIN 4.2 03/09/2024 1604   AST 23 03/09/2024 1604   ALT 24 03/09/2024 1604   ALKPHOS 107 03/09/2024 1604   BILITOT 0.2 03/09/2024 1604   GFRNONAA 75 09/12/2015 1010   GFRAA 86 09/12/2015 1010     Assessment/Plan:   58 year old female history of stroke (2018), hypertension, SLE, history of colon polyps, presents for evaluation of rectal bleeding  Rectal bleeding Positive Hemoccult One episode rectal bleeding associated with large stool followed by intermittent rectal bleeding. Positive fecal occult. No anemia. Colonoscopy 01/2023 with Dr. Onita showing two polyps (1 TA), diverticulosis, multiple diminutive polyps in rectum (resection not attempted). Unfortunately patient declined rectal exam today. Occasional BRBPR on tissue paper without pain likely indicative of internal hemorrhoid.  Reassuring that she has had a colonoscopy within the last year. -- Increase water, increase fiber, increase exercise - Hydrocortisone  suppositories twice daily for 2 weeks - Sitz bath's - Follow-up with me 6 to 8 weeks - If persistent bleeding recommend flexible sigmoidoscopy -  Hemoccult likely positive in the presence of active bleeding.  We can consider rechecking this if her bleeding resolves to ensure she is not losing microscopic blood in her GI tract  History of colon polyps Colonoscopy 01/2023 with  1 tubular adenoma.  Family history of colon cancer in grandmother. - Repeat 7 years  Assigned to Dr. Abran today (Wednesday)  Dawn Lara [facility] 07/14/2024, 8:59 AM  Cc: Georgina Speaks, FNP

## 2024-07-14 ENCOUNTER — Ambulatory Visit: Admitting: Gastroenterology

## 2024-07-14 ENCOUNTER — Encounter: Payer: Self-pay | Admitting: Gastroenterology

## 2024-07-14 VITALS — BP 124/80 | HR 79 | Ht 67.0 in | Wt 240.0 lb

## 2024-07-14 DIAGNOSIS — K625 Hemorrhage of anus and rectum: Secondary | ICD-10-CM | POA: Diagnosis not present

## 2024-07-14 DIAGNOSIS — Z860100 Personal history of colon polyps, unspecified: Secondary | ICD-10-CM | POA: Diagnosis not present

## 2024-07-14 DIAGNOSIS — K649 Unspecified hemorrhoids: Secondary | ICD-10-CM | POA: Diagnosis not present

## 2024-07-14 MED ORDER — HYDROCORTISONE ACETATE 25 MG RE SUPP: 25.0000 mg | Freq: Two times a day (BID) | RECTAL | 0 refills | Status: AC

## 2024-07-14 MED ORDER — HYDROCORTISONE (PERIANAL) 2.5 % EX CREA: 1.0000 | TOPICAL_CREAM | Freq: Two times a day (BID) | CUTANEOUS | 1 refills | Status: AC

## 2024-07-14 NOTE — Progress Notes (Signed)
 Noted

## 2024-07-14 NOTE — Patient Instructions (Addendum)
 _______________________________________________________  If your blood pressure at your visit was 140/90 or greater, please contact your primary care physician to follow up on this.  _______________________________________________________  If you are age 59 or older, your body mass index should be between 23-30. Your Body mass index is 37.59 kg/m. If this is out of the aforementioned range listed, please consider follow up with your Primary Care Provider.  If you are age 59 or younger, your body mass index should be between 19-25. Your Body mass index is 37.59 kg/m. If this is out of the aformentioned range listed, please consider follow up with your Primary Care Provider.   ________________________________________________________  The [facility] providers would like to encourage you to use MYCHART to communicate with providers for non-urgent requests or questions.  Due to long hold times on the telephone, sending your provider a message by [facility] may be a faster and more efficient way to get a response.  Please allow 48 business hours for a response.  Please remember that this is for non-urgent requests.  _______________________________________________________  [facility] is using a team-based approach to care.  Your team is made up of your doctor and two to three APPS. Our APPS (Nurse Practitioners and Physician Assistants) work with your physician to ensure care continuity for you. They are fully qualified to address your health concerns and develop a treatment plan. They communicate directly with your gastroenterologist to care for you. Seeing the Advanced Practice Practitioners on your physician's team can help you by facilitating care more promptly, often allowing for earlier appointments, access to diagnostic testing, procedures, and other specialty referrals.   You have been scheduled for an appointment with Dawn Lara on 09-01-24 at 820am . Please arrive 10 minutes  early for your appointment.  If the hemorrhoid suppository sent in is too expensive you can do this over the counter trick.  Apply a pea size amount of generic prescription Anusol  HC cream that has been sent into your pharmacy to the tip of an over the counter PrepH suppository and insert rectally   VISIT SUMMARY:  You came in today because you have been experiencing rectal bleeding. We discussed your history of polyps and diverticulitis, and reviewed your recent colonoscopy results. We have identified the likely cause of your bleeding and have created a plan to address it.  YOUR PLAN:  -RECTAL BLEEDING DUE TO INTERNAL HEMORRHOIDS: Rectal bleeding is likely caused by internal hemorrhoids, which are swollen blood vessels in the rectum. You will use steroid suppositories twice daily for 14 days to reduce inflammation. If your insurance does not cover the prescription, you can use over-the-counter suppositories with the prescription cream. Sitz baths are recommended for relief, and you should increase your dietary fiber intake with Benefiber or Metamucil. If the bleeding continues, we may consider a flexible sigmoidoscopy with enema prep.  -COLONIC POLYPS, HISTORY OF REMOVAL AND SURVEILLANCE: You have a history of colonic polyps, with your most recent colonoscopy showing two small polyps, one of which was precancerous. The current findings are reassuring, and there is no immediate concern for cancer.  -DIVERTICULOSIS OF COLON WITHOUT PERFORATION OR ABSCESS: Diverticulosis is a condition where small pouches form in the colon, often due to a low-fiber diet. To manage this, you should increase your intake of green leafy vegetables, fruits, and other vegetables. Fiber supplements like Benefiber or Metamucil can also be helpful.  INSTRUCTIONS:  Please follow the treatment plan for your internal hemorrhoids and increase your dietary fiber intake as discussed.  If the rectal bleeding persists, we may need to  perform a flexible sigmoidoscopy. Continue with your regular colonoscopy screenings as recommended.

## 2024-08-06 ENCOUNTER — Encounter: Payer: Self-pay | Admitting: Family Medicine

## 2024-08-06 DIAGNOSIS — Z1231 Encounter for screening mammogram for malignant neoplasm of breast: Secondary | ICD-10-CM

## 2024-09-01 ENCOUNTER — Ambulatory Visit: Admitting: Gastroenterology

## 2024-09-08 NOTE — Progress Notes (Unsigned)
 Dawn Lara, CMA,acting as a neurosurgeon for Dawn Ada, FNP.,have documented all relevant documentation on the behalf of Dawn Ada, FNP,as directed by  Dawn Ada, FNP while in the presence of Dawn Ada, FNP.  Subjective:  Patient ID: Dawn Lara , female    DOB: 02-24-1966 , 59 y.o.   MRN: [id number]  No chief complaint on file.   HPI  HPI   Past Medical History:  Diagnosis Date   Arthritis    Diverticulosis    Elevated cholesterol    History of colon polyps    Hypertension 2000   Well controlled on medication   Mnire's disease in remission 2005   Involving Right ear only--wore a hearing aide at one point, but not a problem now.   Obesity    SLE (systemic lupus erythematosus) (HCC) 1986   Treated with prednisone and cyclosporine.  In remission by 1990.  Did have kidney/joint involvement, though states has had a short flare up in past 12 years.  Flares treated with prednisone for 7-10 days.  No longer followed by a Rheumatologist.   Stroke (HCC) 11/2016     Family History  Problem Relation Age of Onset   Hyperlipidemia Mother    Asthma Mother    Diabetes Father    Heart disease Father        Several MIs   Hypertension Father    Hypertension Brother    Breast cancer Paternal Grandmother     Current Medications[1]   Allergies[2]   Review of Systems   There were no vitals filed for this visit. There is no height or weight on file to calculate BMI.  Wt Readings from Last 3 Encounters:  07/14/24 240 lb (108.9 kg)  04/27/24 239 lb 12.8 oz (108.8 kg)  03/09/24 230 lb (104.3 kg)    The ASCVD Risk score (Arnett DK, et al., 2019) failed to calculate for the following reasons:   Risk score cannot be calculated because patient has a medical history suggesting prior/existing ASCVD   * - Cholesterol units were assumed  Objective:  Physical Exam      Assessment And Plan:   Assessment & Plan Prediabetes  Primary hypertension   No orders of the  defined types were placed in this encounter.    No follow-ups on file.  Patient was given opportunity to ask questions. Patient verbalized understanding of the plan and was able to repeat key elements of the plan. All questions were answered to their satisfaction.    Dawn Dawn Ada, FNP, have reviewed all documentation for this visit. The documentation on 09/08/24 for the exam, diagnosis, procedures, and orders are all accurate and complete.   IF YOU HAVE BEEN REFERRED TO A SPECIALIST, IT MAY TAKE 1-2 WEEKS TO SCHEDULE/PROCESS THE REFERRAL. IF YOU HAVE NOT HEARD FROM US /SPECIALIST IN TWO WEEKS, PLEASE GIVE US  A CALL AT [phone].     [1]  Current Outpatient Medications:    amLODipine  (NORVASC ) 2.5 MG tablet, TAKE 1 TABLET BY MOUTH EVERY DAY, Disp: 90 tablet, Rfl: 1   ASPIRIN LOW DOSE 81 MG EC tablet, Take 81 mg by mouth daily., Disp: , Rfl:    atorvastatin  (LIPITOR) 40 MG tablet, TAKE 1 TABLET BY MOUTH EVERY DAY, Disp: 90 tablet, Rfl: 1   cetirizine (ZYRTEC) 10 MG tablet, Take 10 mg by mouth daily., Disp: , Rfl:    Cholecalciferol 50 MCG (2000 UT) CAPS, Take by mouth., Disp: , Rfl:    fluticasone (FLONASE) 50  MCG/ACT nasal spray, Place into both nostrils daily., Disp: , Rfl:    hydrocortisone  (ANUSOL -HC) 2.5 % rectal cream, Place 1 Application rectally 2 (two) times daily. For 14 days, Disp: 30 g, Rfl: 1   hydrocortisone  (ANUSOL -HC) 25 MG suppository, Place 1 suppository (25 mg total) rectally 2 (two) times daily. For 14 days, Disp: 28 suppository, Rfl: 0   ibuprofen  (ADVIL ) 600 MG tablet, Take 1 tablet (600 mg total) by mouth every 6 (six) hours as needed. With food, Disp: 30 tablet, Rfl: 0   lisinopril  (ZESTRIL ) 30 MG tablet, TAKE 1 TABLET BY MOUTH EVERY DAY, Disp: 90 tablet, Rfl: 1   MAGNESIUM GLUCONATE PO, Take by mouth., Disp: , Rfl:    Multiple Vitamins-Minerals (MULTIVITAMIN ADULT, MINERALS,) TABS, Take 1 tablet by mouth daily. With CoQ 10 and Omega 3, Disp: , Rfl:     Probiotic Product (PROBIOTIC BLEND PO), Take by mouth., Disp: , Rfl:  [2]  Allergies Allergen Reactions   Sulfa Antibiotics Itching

## 2024-09-09 ENCOUNTER — Encounter: Payer: Self-pay | Admitting: Nurse Practitioner

## 2024-09-09 ENCOUNTER — Ambulatory Visit: Payer: Self-pay | Admitting: Nurse Practitioner

## 2024-09-09 VITALS — BP 120/80 | HR 69 | Temp 98.4°F | Ht 67.0 in | Wt 233.8 lb

## 2024-09-09 DIAGNOSIS — I10 Essential (primary) hypertension: Secondary | ICD-10-CM

## 2024-09-09 DIAGNOSIS — M329 Systemic lupus erythematosus, unspecified: Secondary | ICD-10-CM | POA: Diagnosis not present

## 2024-09-09 DIAGNOSIS — E6609 Other obesity due to excess calories: Secondary | ICD-10-CM

## 2024-09-09 DIAGNOSIS — E66812 Obesity, class 2: Secondary | ICD-10-CM | POA: Diagnosis not present

## 2024-09-09 DIAGNOSIS — E782 Mixed hyperlipidemia: Secondary | ICD-10-CM

## 2024-09-09 DIAGNOSIS — Z6836 Body mass index (BMI) 36.0-36.9, adult: Secondary | ICD-10-CM

## 2024-09-09 DIAGNOSIS — R7303 Prediabetes: Secondary | ICD-10-CM

## 2024-09-09 LAB — BMP8+EGFR
BUN/Creatinine Ratio: 16 (ref 9–23)
BUN: 18 mg/dL (ref 6–24)
CO2: 23 mmol/L (ref 20–29)
Calcium: 9.8 mg/dL (ref 8.7–10.2)
Chloride: 100 mmol/L (ref 96–106)
Creatinine, Ser: 1.12 mg/dL — ABNORMAL HIGH (ref 0.57–1.00)
Glucose: 70 mg/dL (ref 70–99)
Potassium: 4.8 mmol/L (ref 3.5–5.2)
Sodium: 137 mmol/L (ref 134–144)
eGFR: 57 mL/min/1.73 — ABNORMAL LOW

## 2024-09-09 LAB — LIPID PANEL
Chol/HDL Ratio: 3.3 ratio (ref 0.0–4.4)
Cholesterol, Total: 156 mg/dL (ref 100–199)
HDL: 48 mg/dL
LDL Chol Calc (NIH): 89 mg/dL (ref 0–99)
Triglycerides: 104 mg/dL (ref 0–149)
VLDL Cholesterol Cal: 19 mg/dL (ref 5–40)

## 2024-09-09 LAB — HEMOGLOBIN A1C
Est. average glucose Bld gHb Est-mCnc: 114 mg/dL
Hgb A1c MFr Bld: 5.6 % (ref 4.8–5.6)

## 2024-09-09 NOTE — Assessment & Plan Note (Signed)
 Congratulated on her weight loss from 240 lbs to 233 lbs. Following high protein, low carb diet and regular exercise.

## 2024-09-09 NOTE — Assessment & Plan Note (Signed)
 Blood pressure controlled at 120/80 mmHg. - Continue current antihypertensive regimen.

## 2024-09-09 NOTE — Assessment & Plan Note (Signed)
 History of lupus in 1990's no flares since that time. Seen in notes from [facility]

## 2024-09-09 NOTE — Assessment & Plan Note (Signed)
 A1c levels are stable. Will check A1c today. Continue focusing on low carb diet. Continued monitoring necessary.

## 2024-09-09 NOTE — Assessment & Plan Note (Signed)
 Cholesterol levels decreased but remain elevated. Atorvastatin  taken five days a week. - Recheck cholesterol levels. - Continue atorvastatin  as prescribed.

## 2024-09-20 ENCOUNTER — Ambulatory Visit: Payer: Self-pay | Admitting: Nurse Practitioner

## 2025-03-14 ENCOUNTER — Encounter: Payer: Self-pay | Admitting: Nurse Practitioner
